# Patient Record
Sex: Female | Born: 1995 | Hispanic: Yes | Marital: Single | Smoking: Never smoker
Health system: Southern US, Community
[De-identification: ages and names within clinical notes are randomized; demographics above are authoritative.]

## PROBLEM LIST (undated history)

## (undated) DIAGNOSIS — J45909 Unspecified asthma, uncomplicated: Secondary | ICD-10-CM

## (undated) DIAGNOSIS — M797 Fibromyalgia: Secondary | ICD-10-CM

## (undated) DIAGNOSIS — D8989 Other specified disorders involving the immune mechanism, not elsewhere classified: Secondary | ICD-10-CM

## (undated) DIAGNOSIS — E079 Disorder of thyroid, unspecified: Secondary | ICD-10-CM

## (undated) DIAGNOSIS — M352 Behcet's disease: Secondary | ICD-10-CM

## (undated) DIAGNOSIS — G43909 Migraine, unspecified, not intractable, without status migrainosus: Secondary | ICD-10-CM

---

## 2014-06-13 ENCOUNTER — Emergency Department (HOSPITAL_COMMUNITY)
Admission: EM | Admit: 2014-06-13 | Discharge: 2014-06-13 | Disposition: A | Discharge status: Home / Self Care | Payer: Medicaid Other | Attending: Emergency Medicine | Admitting: Emergency Medicine

## 2014-06-13 ENCOUNTER — Encounter (HOSPITAL_COMMUNITY): Payer: Self-pay | Admitting: Emergency Medicine

## 2014-06-13 DIAGNOSIS — M352 Behcet's disease: Secondary | ICD-10-CM | POA: Diagnosis not present

## 2014-06-13 DIAGNOSIS — Z7952 Long term (current) use of systemic steroids: Secondary | ICD-10-CM | POA: Insufficient documentation

## 2014-06-13 DIAGNOSIS — R52 Pain, unspecified: Secondary | ICD-10-CM | POA: Diagnosis present

## 2014-06-13 DIAGNOSIS — Z792 Long term (current) use of antibiotics: Secondary | ICD-10-CM | POA: Insufficient documentation

## 2014-06-13 DIAGNOSIS — Z79899 Other long term (current) drug therapy: Secondary | ICD-10-CM | POA: Diagnosis not present

## 2014-06-13 HISTORY — DX: Other specified disorders involving the immune mechanism, not elsewhere classified: D89.89

## 2014-06-13 HISTORY — DX: Fibromyalgia: M79.7

## 2014-06-13 LAB — URINE MICROSCOPIC-ADD ON

## 2014-06-13 LAB — URINALYSIS, ROUTINE W REFLEX MICROSCOPIC
BILIRUBIN URINE: NEGATIVE
Glucose, UA: NEGATIVE mg/dL
HGB URINE DIPSTICK: NEGATIVE
Ketones, ur: NEGATIVE mg/dL
NITRITE: NEGATIVE
PH: 7 (ref 5.0–8.0)
Protein, ur: NEGATIVE mg/dL
Specific Gravity, Urine: 1.013 (ref 1.005–1.030)
Urobilinogen, UA: 0.2 mg/dL (ref 0.0–1.0)

## 2014-06-13 MED ORDER — OXYCODONE-ACETAMINOPHEN 5-325 MG PO TABS: 1.0000 | ORAL_TABLET | ORAL | Status: DC | PRN

## 2014-06-13 MED ORDER — MAGIC MOUTHWASH W/LIDOCAINE: 5.0000 mL | Freq: Three times a day (TID) | ORAL | Status: DC

## 2014-06-13 MED ORDER — DEXAMETHASONE SODIUM PHOSPHATE 10 MG/ML IJ SOLN
10.0000 mg | Freq: Once | INTRAMUSCULAR | Status: AC
  Administered 2014-06-13: 10 mg via INTRAMUSCULAR
  Filled 2014-06-13: qty 1

## 2014-06-13 MED ORDER — PREDNISONE 20 MG PO TABS: 40.0000 mg | ORAL_TABLET | Freq: Every day | ORAL | Status: DC

## 2014-06-13 NOTE — Discharge Instructions (Signed)
Behcet Syndrome Behcet syndrome is a rare disorder that involves inflammation of blood vessels (vasculitis) throughout your body. This condition usually begins between the ages of 20 years and 40 years. Behcet syndrome can range from mild to severe and is a condition you will have for the rest of your life (chronic). There is no cure, but symptoms may go away on their own for periods of time. It can cause various symptoms, including open sores (ulcers) in your mouth or on your genitals. It can affect many organs and systems in your body, including your heart, lungs, digestive system, and nervous systems. It can sometimes lead to blindness. Behcet syndrome is not spread from person to person (contagious). CAUSES  The exact cause is unknown. The condition tends to run in families. Some genes that increase risk for Behcet syndrome have been identified. If you inherit these genes, it may increase your risk.  RISK FACTORS  Being of Asian, Middle Eastern, or Turkish descent.  Being 20-40 years of age. SYMPTOMS  Signs and symptoms vary depending on the areas of the body that are affected. Early and common signs and symptoms include:   Open sores on your:  Mouth. These may look like canker sores. The sores may come and go.  Genitals. These may come and go and leave scars when they heal.  Skin. These may appear as painful red bumps or pimples.  Eye problems including:  Redness.  Blurred vision.  Tearing.  Pain.  Inflammation (uveitis).  Arthritis.   Swelling of the brain and spinal cord (meningoencephalitis). Less common signs and symptoms may develop over time and can include:   Abdominal pain and bleeding in your digestive system.  Memory loss.  Behavior changes.  Loss of interest in things you enjoy (apathy).  Seizures.  Blood clots.  Weakening of blood vessels (aneurysms).  Chest pain.  Trouble breathing.  Impaired speech, balance, and movement. DIAGNOSIS  Behcet  syndrome is hard to diagnose because there will be times when you are symptom free. Your health care provider may diagnose the condition based on your medical history and a physical exam. The main factors that help confirm the diagnosis are presence of mouth sores at least three times in 1 year and any two of the following:  Genital sores that keep coming back.  Eye inflammation with loss of vision.  Skin sores that are characteristic of Behcet syndrome.  A positive skin prick test. If you have the condition, a skin prick may produce a red bump in 1-2 days. Yourhealth care provider may perform additional tests, including:   CT or MRI scans of your joints, brain, or bones.  Blood vessel studies (angiogram).  Removing pieces of affected body tissue (biopsy) to check for vasculitis. TREATMENT  There is no cure for Behcet syndrome. Treatment typically focuses on relieving your discomfort and preventing serious complications. You may need to work with a team of health care providers because many different parts of your body may be involved. Common treatments include:  Strong anti-inflammatory medicines (corticosteroids).  Medicines that suppress your immune system and treat inflammation.  Steroid creams to treat oral and genital ulcers.  Other medicines your health care team may recommend based on your symptoms and the parts of your body involved. HOME CARE INSTRUCTIONS Follow all your health care provider's instructions. Theinstructions you get will depend on your specific symptoms and treatments. General instructions may include:  Get plenty of rest, especially when your symptoms worsen.  Get moderate exercise (  walking and swimming) when not experiencing worsening of symptoms.  Include lots of vegetables, fruits, and whole grains in your diet.  Avoid high-fat foods.  Do not smoke.  Keep all follow-up appointments. SEEK MEDICAL CARE IF:  Your symptoms worsen and are not  managed by medicines and home care. SEEK IMMEDIATE MEDICAL CARE IF:  You suddenly lose your vision.  You vomit blood or have blood in your stool.  You have very bad abdominal pain.  You suddenly have a very bad headache.  You have a seizure.  You have a red, warm, or tender swelling in your leg.  You have chest pain or trouble breathing. FOR MORE INFORMATION American Behcet's Disease Association: www.behcets.com Document Released: 02/20/2002 Document Revised: 03/07/2013 Document Reviewed: 01/31/2013 ExitCare Patient Information 2015 [employer]. This information is not intended to replace advice given to you by your health care provider. Make sure you discuss any questions you have with your health care provider.  

## 2014-06-13 NOTE — ED Provider Notes (Signed)
CSN: [id number]     Arrival date & time 06/13/14  1352 History   First MD Initiated Contact with Patient 06/13/14 1515     Chief Complaint  Patient presents with  . body inflammed   . auto immune      (Consider location/radiation/quality/duration/timing/severity/associated sxs/prior Treatment) HPI   Raylene Paolucci Is a [age] year old female with a past medical history of Behcet's syndrome. She presents emergency Department with complaint of Behcet's flare. [place] The patient is uninsured and has been unable to follow-up with a rheumatologist. She states that she normally has aching in her muscles, which she deals with on a daily basis. However, over the past week she has developed ulcerations on the tongue and inside of her cheeks. She is also beginning to develop ulcers on her genital region and her left breast. The patient states that she came in for a round of prednisone and she hopes to halt her flare before it continues to worsen. The patient has previously been on immunosuppressive therapy is which included infusion therapies. The patient normally takes 40 mg of prednisone daily. She has been out of all of her medications for the past month. She denies any other medical complaints at this time.  Past Medical History  Diagnosis Date  . Fibromyalgia   . Autoimmune disorder    History reviewed. No pertinent past surgical history. No family history on file. History  Substance Use Topics  . Smoking status: Never Smoker   . Smokeless tobacco: Not on file  . Alcohol Use: No   OB History    No data available     Review of Systems  Ten systems reviewed and are negative for acute change, except as noted in the HPI.    Allergies  Review of patient's allergies indicates no known allergies.  Home Medications   Prior to Admission medications   Medication Sig Start Date End Date Taking? Authorizing Provider  azathioprine (IMURAN) 100  MG tablet Take 100 mg by mouth daily.   Yes Historical Provider, MD  azaTHIOprine (IMURAN) 50 MG tablet Take 50 mg by mouth daily.   Yes Historical Provider, MD  DULoxetine (CYMBALTA) 20 MG capsule Take 20 mg by mouth daily.   Yes Historical Provider, MD  levothyroxine (SYNTHROID, LEVOTHROID) 100 MCG tablet Take 100 mcg by mouth daily before breakfast.   Yes Historical Provider, MD  metaxalone (SKELAXIN) 800 MG tablet Take 800 mg by mouth daily.   Yes Historical Provider, MD  methylphenidate 18 MG PO CR tablet Take 18 mg by mouth daily.   Yes Historical Provider, MD  predniSONE (DELTASONE) 20 MG tablet Take 40 mg by mouth daily with breakfast.   Yes Historical Provider, MD  zolpidem (AMBIEN) 5 MG tablet Take 5 mg by mouth at bedtime as needed for sleep.   Yes Historical Provider, MD   BP 103/61 mmHg  Pulse 95  Temp(Src) 98.5 F (36.9 C) (Oral)  Resp 18  Ht 5' 1.5" (1.562 m)  Wt 144 lb 9 oz (65.573 kg)  BMI 26.88 kg/m2  SpO2 98%  LMP 05/31/2014 Physical Exam  Constitutional: She is oriented to person, place, and time. She appears well-developed and well-nourished. No distress.  HENT:  Head: Normocephalic and atraumatic.  Shallow ulcerations on the palmar, left bucca mucosa. Hard pallet is erythematous with minimal swelling  Eyes: Conjunctivae are normal. No scleral icterus.  Neck: Normal range of motion.  Cardiovascular: Normal rate, regular rhythm and normal  heart sounds.  Exam reveals no gallop and no friction rub.   No murmur heard. Pulmonary/Chest: Effort normal and breath sounds normal. No respiratory distress.  ulcer present on the left breast  Abdominal: Soft. Bowel sounds are normal. She exhibits no distension and no mass. There is no tenderness. There is no guarding.  Genitourinary:  Genital exam deferred  Neurological: She is alert and oriented to person, place, and time.  Skin: Skin is warm and dry. She is not diaphoretic.  Nursing note and vitals reviewed.   ED Course   Procedures (including critical care time) Labs Review Labs Reviewed  URINALYSIS, ROUTINE W REFLEX MICROSCOPIC    Imaging Review No results found.   EKG Interpretation None      MDM   Final diagnoses:  Behcet's syndrome    Patient with patient's flare. Given a dose of IM Decadron here. We'll discharge with 40 mg of prednisone daily. Patient will also get much mouthwash for her stomatitis as well as pain medication. Patient is advised [facility]-term care. She appears safe for discharge at this time.    Arthor Captain, PA-C 06/13/14 1645  Gwyneth Sprout, MD 06/14/14 [id number]

## 2014-06-13 NOTE — ED Notes (Addendum)
Pt states that she has auto-immune disorders and takes medications and gets Rimacade infusions but moved [place] from [place] and not set up with PCP.  Pt c/o leg pain and feels like her entire body is inflamed.

## 2014-06-13 NOTE — ED Notes (Addendum)
Patient sees several specialist for her autoimmune disorder, but as previously stated she has not obtained a new doctor [place]  Patient has Behcets Syndrome and periodically has flare ups. She has been off of her medication for at least a month.

## 2014-06-14 ENCOUNTER — Ambulatory Visit: Payer: Medicaid Other | Attending: Physician Assistant | Admitting: Physician Assistant

## 2014-06-14 VITALS — BP 88/54 | HR 75 | Temp 97.9°F | Resp 16 | Ht 61.5 in | Wt 144.0 lb

## 2014-06-14 DIAGNOSIS — F909 Attention-deficit hyperactivity disorder, unspecified type: Secondary | ICD-10-CM | POA: Diagnosis not present

## 2014-06-14 DIAGNOSIS — G8929 Other chronic pain: Secondary | ICD-10-CM | POA: Insufficient documentation

## 2014-06-14 DIAGNOSIS — M352 Behcet's disease: Secondary | ICD-10-CM

## 2014-06-14 DIAGNOSIS — F329 Major depressive disorder, single episode, unspecified: Secondary | ICD-10-CM | POA: Insufficient documentation

## 2014-06-14 MED ORDER — OXYCODONE-ACETAMINOPHEN 5-325 MG PO TABS: 1.0000 | ORAL_TABLET | ORAL | Status: DC | PRN

## 2014-06-14 MED ORDER — LEVOTHYROXINE SODIUM 100 MCG PO TABS: 100.0000 ug | ORAL_TABLET | Freq: Every day | ORAL | Status: DC

## 2014-06-14 MED ORDER — DULOXETINE HCL 20 MG PO CPEP: 20.0000 mg | ORAL_CAPSULE | Freq: Every day | ORAL | Status: DC

## 2014-06-14 MED ORDER — AZATHIOPRINE 100 MG PO TABS: 100.0000 mg | ORAL_TABLET | Freq: Every day | ORAL | Status: DC

## 2014-06-14 MED ORDER — MAGIC MOUTHWASH W/LIDOCAINE: 5.0000 mL | Freq: Three times a day (TID) | ORAL | Status: DC

## 2014-06-14 MED ORDER — METHYLPHENIDATE HCL ER (OSM) 18 MG PO TBCR: 18.0000 mg | EXTENDED_RELEASE_TABLET | Freq: Every day | ORAL | Status: DC

## 2014-06-14 MED ORDER — PREDNISONE 20 MG PO TABS: 40.0000 mg | ORAL_TABLET | Freq: Every day | ORAL | Status: DC

## 2014-06-14 MED ORDER — ONDANSETRON HCL 4 MG PO TABS: 4.0000 mg | ORAL_TABLET | Freq: Three times a day (TID) | ORAL | Status: DC | PRN

## 2014-06-14 NOTE — Progress Notes (Addendum)
Patient presents for med refills for auto immune disorders Ran out of all meds 1 month ago Recently moved to area and wants to establish care with PCP C/o b/l knee pain rates 7/10 at present Low right back pain; rates 6/10 at present Inflammation in mouth which she states leads to ulcers Also irritation on left breast and genital area States father in hospital at present awaiting heart transplant States hx of depression especially during disease flares Never smoker  BP 88/54 states she felt she might pass out this morning but not at present  PHQ-9 score of 13 Gad 12 Provider aware Patient also given information on counseling with in-house LCSW

## 2014-06-14 NOTE — Patient Instructions (Signed)
We will work on an BankerAppointment with financial counselor I have refilled all your medications We will work on a referral to rheumatology

## 2014-06-14 NOTE — Progress Notes (Signed)
Ashley Anthony  ZOX:[id number]  [id number]  DOB - [birth year]  Chief Complaint  Patient presents with  . Knee Pain    bilateral  . Back Pain  . Mouth Lesions       Subjective:   Ashley Anthony is a 19 y.o. female here today for establishment of care. She presented to the emergency department on 06/13/2014 with complaints of right lower back pain, bilateral knee pain, and ulcers in her mouth and on her genital area. She was diagnosed with Behcet's Syndrome at the age of 74. At this time she lived in [place]. She most recently comes to [place] by way of [employer], [place]. She is only been here for 1 month. During that time she's not had any medical insurance and has not been on any medications. She clearly was having a flare of her autoimmune disorder and was given an IM injection of Decadron, prednisone tablets to go home on, and a mouthwash. These medications were filled. She has being given minimal improvement in her symptoms. She is looking for refills on her chronic medications and potentially a referral to rheumatology for further assistance with her autoimmune disorder. She has also been off her thyroid and ADHD medications. Her complaints continue as above. Nothing new that this presentation.   ROS: GEN: denies fever or chills, denies change in weight Skin: positive lesions and rashes in mouth and pubic area HEENT: denies headache, earache, epistaxis, sore throat, or neck pain LUNGS: denies SHOB, dyspnea, PND, orthopnea CV: denies CP or palpitations ABD: denies abd pain, N or V EXT: + muscle spasms or swelling; + pain in lower ext, no weakness NEURO: denies numbness or tingling, denies sz, stroke or TIA  ALLERGIES: No Known Allergies  PAST MEDICAL HISTORY: Past Medical History  Diagnosis Date  . Fibromyalgia   . Autoimmune disorder   ADHD MDD  PAST SURGICAL HISTORY: History reviewed. No pertinent past surgical history.  MEDICATIONS AT HOME: Prior to Admission  medications   Medication Sig Start Date End Date Taking? Authorizing Provider  oxyCODONE-acetaminophen (PERCOCET) 5-325 MG per tablet Take 1 tablet by mouth every 4 (four) hours as needed. 06/14/14  Yes Lyndel Sarate Netta Cedars, PA-C  predniSONE (DELTASONE) 20 MG tablet Take 2 tablets (40 mg total) by mouth daily. 06/14/14  Yes Theone Bowell Netta Cedars, PA-C  Alum & Mag Hydroxide-Simeth (MAGIC MOUTHWASH W/LIDOCAINE) SOLN Take 5 mLs by mouth 3 (three) times daily. 06/14/14   Beverly Suriano Netta Cedars, PA-C  azathioprine (IMURAN) 100 MG tablet Take 1 tablet (100 mg total) by mouth daily. 06/14/14   Caidynce Muzyka Netta Cedars, PA-C  azaTHIOprine (IMURAN) 50 MG tablet Take 50 mg by mouth daily.    Historical Provider, MD  DULoxetine (CYMBALTA) 20 MG capsule Take 1 capsule (20 mg total) by mouth daily. 06/14/14   Vivianne Master, PA-C  levothyroxine (SYNTHROID, LEVOTHROID) 100 MCG tablet Take 1 tablet (100 mcg total) by mouth daily before breakfast. 06/14/14   Vivianne Master, PA-C  metaxalone (SKELAXIN) 800 MG tablet Take 800 mg by mouth daily.    Historical Provider, MD  methylphenidate 18 MG PO CR tablet Take 1 tablet (18 mg total) by mouth daily. 06/14/14   Vivianne Master, PA-C  zolpidem (AMBIEN) 5 MG tablet Take 5 mg by mouth at bedtime as needed for sleep.    Historical Provider, MD     Objective:   Filed Vitals:   06/14/14 0939  BP: 88/54  Pulse: 75  Temp: 97.9 F (36.6 C)  TempSrc:  Oral  Resp: 16  Height: 5' 1.5" (1.562 m)  Weight: 144 lb (65.318 kg)  SpO2: 5%    Exam General appearance : Awake, alert, not in any distress. Speech Clear. Not toxic looking HEENT: Atraumatic and Normocephalic, pupils equally reactive to light and accomodation; mouth raised round plaques/lesions especially left tongue Neck: supple, no JVD. No cervical lymphadenopathy.  Chest:Good air entry bilaterally, no added sounds  CVS: S1 S2 regular, no murmurs.  Extremities: B/L Lower Ext shows no edema, both legs are warm to touch  Data Review No results  found for: HGBA1C   Assessment & Plan  1. Behcet's Syndrome  -refill chronic meds>Imuran, Prednisone, pain meds, mouthwash  -referral to Rheumatology  -reassurance for improvement over 1-3 weeks  -offered topical steroids, pt declines at this time 2. Depression  -refill Cymbalta  3. Chronic Pain 2/2 #1  -refill Percocet 4. ADHD  -refill Methylphenidate  Referral to financial counselor Return in about 2 weeks (around 06/28/2014).  The patient was given clear instructions to go to ER or return to medical center if symptoms don't improve, worsen or new problems develop. The patient verbalized understanding. The patient was told to call to get lab results if they haven't heard anything in the next week.   This note has been created with Education officer, environmentalDragon speech recognition software and smart phrase technology. Any transcriptional errors are unintentional.    Scot Juniffany Amaya Blakeman, PA-C [facility] [place], [place] [phone]   06/14/2014, 10:23 AM

## 2014-06-15 ENCOUNTER — Telehealth: Payer: Self-pay | Admitting: General Practice

## 2014-06-15 NOTE — Telephone Encounter (Signed)
Patient was seen in clinic yesterday, 06/14/14 by NP, Tiffany for ED follow up. Patient states she was to be prescribed Ambien for insomnia. Patient states she picked up all of her other medications but the Remus Lofflerambien was not ready.  Please follow up with patient to assist.

## 2014-06-18 ENCOUNTER — Encounter: Payer: Self-pay | Admitting: *Deleted

## 2014-06-18 ENCOUNTER — Telehealth: Payer: Self-pay | Admitting: *Deleted

## 2014-06-18 NOTE — Telephone Encounter (Signed)
Patient called in to get RX for Ambien.  She has follow up appointment next week.  Told her that the PA Tiffany was not in the office today and so med could not be refilled.  Instructed her to wait for her appointment next week and ask provider for refill then.

## 2014-06-19 ENCOUNTER — Telehealth: Payer: Self-pay

## 2014-06-19 NOTE — Telephone Encounter (Signed)
Not available at number provided

## 2014-06-27 ENCOUNTER — Ambulatory Visit: Payer: Medicaid Other | Attending: Internal Medicine | Admitting: Internal Medicine

## 2014-06-27 ENCOUNTER — Encounter: Payer: Self-pay | Admitting: Internal Medicine

## 2014-06-27 VITALS — BP 108/73 | HR 84 | Temp 98.0°F | Resp 16 | Wt 144.2 lb

## 2014-06-27 DIAGNOSIS — G4700 Insomnia, unspecified: Secondary | ICD-10-CM | POA: Insufficient documentation

## 2014-06-27 DIAGNOSIS — F32A Depression, unspecified: Secondary | ICD-10-CM | POA: Insufficient documentation

## 2014-06-27 DIAGNOSIS — F909 Attention-deficit hyperactivity disorder, unspecified type: Secondary | ICD-10-CM

## 2014-06-27 DIAGNOSIS — G894 Chronic pain syndrome: Secondary | ICD-10-CM | POA: Diagnosis not present

## 2014-06-27 DIAGNOSIS — E039 Hypothyroidism, unspecified: Secondary | ICD-10-CM | POA: Insufficient documentation

## 2014-06-27 DIAGNOSIS — F329 Major depressive disorder, single episode, unspecified: Secondary | ICD-10-CM | POA: Diagnosis not present

## 2014-06-27 DIAGNOSIS — M352 Behcet's disease: Secondary | ICD-10-CM | POA: Diagnosis present

## 2014-06-27 MED ORDER — DULOXETINE HCL 30 MG PO CPEP: 30.0000 mg | ORAL_CAPSULE | Freq: Every day | ORAL | Status: DC

## 2014-06-27 NOTE — Progress Notes (Signed)
Patient here for follow up and to establish with a primary physician Patient was diagnosed at the age of 19 with behcets syndrome Suffers from chronic pain Requesting medication for pain

## 2014-06-27 NOTE — Patient Instructions (Signed)
Insomnia Insomnia is frequent trouble falling and/or staying asleep. Insomnia can be a long term problem or a short term problem. Both are common. Insomnia can be a short term problem when the wakefulness is related to a certain stress or worry. Long term insomnia is often related to ongoing stress during waking hours and/or poor sleeping habits. Overtime, sleep deprivation itself can make the problem worse. Every little thing feels more severe because you are overtired and your ability to cope is decreased. CAUSES   Stress, anxiety, and depression.  Poor sleeping habits.  Distractions such as TV in the bedroom.  Naps close to bedtime.  Engaging in emotionally charged conversations before bed.  Technical reading before sleep.  Alcohol and other sedatives. They may make the problem worse. They can hurt normal sleep patterns and normal dream activity.  Stimulants such as caffeine for several hours prior to bedtime.  Pain syndromes and shortness of breath can cause insomnia.  Exercise late at night.  Changing time zones may cause sleeping problems (jet lag). It is sometimes helpful to have someone observe your sleeping patterns. They should look for periods of not breathing during the night (sleep apnea). They should also look to see how long those periods last. If you live alone or observers are uncertain, you can also be observed at a sleep clinic where your sleep patterns will be professionally monitored. Sleep apnea requires a checkup and treatment. Give your caregivers your medical history. Give your caregivers observations your family has made about your sleep.  SYMPTOMS   Not feeling rested in the morning.  Anxiety and restlessness at bedtime.  Difficulty falling and staying asleep. TREATMENT   Your caregiver may prescribe treatment for an underlying medical disorders. Your caregiver can give advice or help if you are using alcohol or other drugs for self-medication. Treatment  of underlying problems will usually eliminate insomnia problems.  Medications can be prescribed for short time use. They are generally not recommended for lengthy use.  Over-the-counter sleep medicines are not recommended for lengthy use. They can be habit forming.  You can promote easier sleeping by making lifestyle changes such as:  Using relaxation techniques that help with breathing and reduce muscle tension.  Exercising earlier in the day.  Changing your diet and the time of your last meal. No night time snacks.  Establish a regular time to go to bed.  Counseling can help with stressful problems and worry.  Soothing music and white noise may be helpful if there are background noises you cannot remove.  Stop tedious detailed work at least one hour before bedtime. HOME CARE INSTRUCTIONS   Keep a diary. Inform your caregiver about your progress. This includes any medication side effects. See your caregiver regularly. Take note of:  Times when you are asleep.  Times when you are awake during the night.  The quality of your sleep.  How you feel the next day. This information will help your caregiver care for you.  Get out of bed if you are still awake after 15 minutes. Read or do some quiet activity. Keep the lights down. Wait until you feel sleepy and go back to bed.  Keep regular sleeping and waking hours. Avoid naps.  Exercise regularly.  Avoid distractions at bedtime. Distractions include watching television or engaging in any intense or detailed activity like attempting to balance the household checkbook.  Develop a bedtime ritual. Keep a familiar routine of bathing, brushing your teeth, climbing into bed at the same   time each night, listening to soothing music. Routines increase the success of falling to sleep faster.  Use relaxation techniques. This can be using breathing and muscle tension release routines. It can also include visualizing peaceful scenes. You can  also help control troubling or intruding thoughts by keeping your mind occupied with boring or repetitive thoughts like the old concept of counting sheep. You can make it more creative like imagining planting one beautiful flower after another in your backyard garden.  During your day, work to eliminate stress. When this is not possible use some of the previous suggestions to help reduce the anxiety that accompanies stressful situations. MAKE SURE YOU:   Understand these instructions.  Will watch your condition.  Will get help right away if you are not doing well or get worse. Document Released: 02/28/2000 Document Revised: 05/25/2011 Document Reviewed: 03/30/2007 ExitCare Patient Information 2015 [employer]. This information is not intended to replace advice given to you by your health care provider. Make sure you discuss any questions you have with your health care provider.  

## 2014-06-27 NOTE — Progress Notes (Signed)
MRN: [id number] Name: Ashley Anthony  Sex: female Age: 19 y.o. DOB: 08/01/1995  Allergies: Review of patient's allergies indicates no known allergies.  Chief Complaint  Patient presents with  . Follow-up    HPI: Patient is 19 y.o. female who  has history of behcet's syndrome , ADHD, chronic pain, hypothyroidism, depression, [place] and went to the emergency room 2 weeks ago with a flare up and subsequently followed with nurse practitioner this office, at that time she was given refill on her medications, patient has also been referred to rheumatology. Patient is accompanied with her mother, also reported to have seen a psychiatrist in the past with a history of depression/fibromyalgia currently taking Cymbalta she is still complaining of pain, patient is also requesting some medication to help her with the sleep.as per patient after taking prednisone her ulcers are improved.  Past Medical History  Diagnosis Date  . Fibromyalgia   . Autoimmune disorder     No past surgical history on file.    Medication List       This list is accurate as of: 06/27/14 11:39 AM.  Always use your most recent med list.               azaTHIOprine 50 MG tablet  Commonly known as:  IMURAN  Take 50 mg by mouth daily.     azathioprine 100 MG tablet  Commonly known as:  IMURAN  Take 1 tablet (100 mg total) by mouth daily.     DULoxetine 30 MG capsule  Commonly known as:  CYMBALTA  Take 1 capsule (30 mg total) by mouth daily.     levothyroxine 100 MCG tablet  Commonly known as:  SYNTHROID, LEVOTHROID  Take 1 tablet (100 mcg total) by mouth daily before breakfast.     magic mouthwash w/lidocaine Soln  Take 5 mLs by mouth 3 (three) times daily.     metaxalone 800 MG tablet  Commonly known as:  SKELAXIN  Take 800 mg by mouth daily.     methylphenidate 18 MG CR tablet  Commonly known as:  CONCERTA  Take 1 tablet (18 mg total) by mouth daily.     ondansetron  4 MG tablet  Commonly known as:  ZOFRAN  Take 1 tablet (4 mg total) by mouth every 8 (eight) hours as needed for nausea or vomiting.     oxyCODONE-acetaminophen 5-325 MG per tablet  Commonly known as:  PERCOCET  Take 1 tablet by mouth every 4 (four) hours as needed.     predniSONE 20 MG tablet  Commonly known as:  DELTASONE  Take 2 tablets (40 mg total) by mouth daily.     zolpidem 5 MG tablet  Commonly known as:  AMBIEN  Take 5 mg by mouth at bedtime as needed for sleep.        Meds ordered this encounter  Medications  . DULoxetine (CYMBALTA) 30 MG capsule    Sig: Take 1 capsule (30 mg total) by mouth daily.    Dispense:  30 capsule    Refill:  3    Immunization History  Administered Date(s) Administered  . Influenza-Unspecified 03/22/2014    Family History  Problem Relation Age of Onset  . Heart disease Father   . Cancer Maternal Aunt   . Diabetes Maternal Grandmother   . Diabetes Paternal Grandfather     History  Substance Use Topics  . Smoking status: Never Smoker   . Smokeless tobacco: Not  on file  . Alcohol Use: No    Review of Systems  Respiratory: Negative for cough and choking.   Cardiovascular: Negative for chest pain.  Gastrointestinal: Negative for nausea, vomiting, abdominal pain and diarrhea.  Musculoskeletal: Positive for arthralgias.  Psychiatric/Behavioral: The patient is nervous/anxious.      As noted in HPI  Filed Vitals:   06/27/14 1025  BP: 108/73  Pulse: 84  Temp: 98 F (36.7 C)  Resp: 16    Physical Exam  Physical Exam  Constitutional: No distress.  Eyes: EOM are normal. Pupils are equal, round, and reactive to light.  Cardiovascular: Normal rate and regular rhythm.   Pulmonary/Chest: Breath sounds normal. No respiratory distress. She has no wheezes. She has no rales.    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  Hypothyroidism, unspecified hypothyroidism type Patient was recently resumed back on her levothyroxine 100 mcg daily, check TSH level on the following visit.  Chronic pain syndrome - Plan: Ambulatory referral to Pain Clinic, I have increased the dose of DULoxetine (CYMBALTA) 30 MG capsule  Depression - Plan: Ambulatory referral to Psychiatry, I have increased the dose of DULoxetine (CYMBALTA) 30 MG capsule  Insomnia Patient is advised for sleep hygiene also she'll take Melatonin.  Attention deficit hyperactivity disorder (ADHD), unspecified ADHD type - Plan: Ambulatory referral to Psychiatry    Return in about 3 months (around 09/26/2014), or if symptoms worsen or fail to improve, for hypothyroid.   This note has been created with Education officer, environmental. Any transcriptional errors are unintentional.    Doris Cheadle, MD

## 2014-07-03 NOTE — Telephone Encounter (Signed)
Error

## 2014-07-09 ENCOUNTER — Telehealth: Payer: Self-pay | Admitting: Clinical

## 2014-07-09 NOTE — Telephone Encounter (Signed)
First attempt to contact pt after referral

## 2014-07-20 ENCOUNTER — Encounter (HOSPITAL_COMMUNITY): Payer: Self-pay | Admitting: Emergency Medicine

## 2014-07-20 ENCOUNTER — Emergency Department (HOSPITAL_COMMUNITY)
Admission: EM | Admit: 2014-07-20 | Discharge: 2014-07-20 | Disposition: A | Discharge status: Home / Self Care | Payer: Medicaid Other | Attending: Emergency Medicine | Admitting: Emergency Medicine

## 2014-07-20 DIAGNOSIS — L98491 Non-pressure chronic ulcer of skin of other sites limited to breakdown of skin: Secondary | ICD-10-CM | POA: Diagnosis not present

## 2014-07-20 DIAGNOSIS — M352 Behcet's disease: Secondary | ICD-10-CM

## 2014-07-20 DIAGNOSIS — Z79899 Other long term (current) drug therapy: Secondary | ICD-10-CM | POA: Diagnosis not present

## 2014-07-20 DIAGNOSIS — M791 Myalgia, unspecified site: Secondary | ICD-10-CM

## 2014-07-20 DIAGNOSIS — J45909 Unspecified asthma, uncomplicated: Secondary | ICD-10-CM | POA: Insufficient documentation

## 2014-07-20 DIAGNOSIS — E079 Disorder of thyroid, unspecified: Secondary | ICD-10-CM | POA: Insufficient documentation

## 2014-07-20 DIAGNOSIS — R52 Pain, unspecified: Secondary | ICD-10-CM | POA: Diagnosis present

## 2014-07-20 HISTORY — DX: Disorder of thyroid, unspecified: E07.9

## 2014-07-20 HISTORY — DX: Unspecified asthma, uncomplicated: J45.909

## 2014-07-20 MED ORDER — PREDNISONE 20 MG PO TABS
40.0000 mg | ORAL_TABLET | Freq: Once | ORAL | Status: AC
  Administered 2014-07-20: 40 mg via ORAL
  Filled 2014-07-20: qty 2

## 2014-07-20 MED ORDER — PREDNISONE 20 MG PO TABS: 40.0000 mg | ORAL_TABLET | Freq: Once | ORAL | Status: DC

## 2014-07-20 MED ORDER — OXYCODONE-ACETAMINOPHEN 5-325 MG PO TABS: 1.0000 | ORAL_TABLET | Freq: Four times a day (QID) | ORAL | Status: DC | PRN

## 2014-07-20 NOTE — ED Notes (Signed)
Pt states she has multiple autoimmune diseases, c/o flare-up that started about a week ago. Pt reports facial swelling, "inflammation everywhere," and generalized body pain. No difficulty breathing or swallowing, pt speaking in complete sentences without difficulty.

## 2014-07-20 NOTE — Discharge Instructions (Signed)
Behcet Syndrome Behcet syndrome is a rare disorder that involves inflammation of blood vessels (vasculitis) throughout your body. This condition usually begins between the ages of 20 years and 40 years. Behcet syndrome can range from mild to severe and is a condition you will have for the rest of your life (chronic). There is no cure, but symptoms may go away on their own for periods of time. It can cause various symptoms, including open sores (ulcers) in your mouth or on your genitals. It can affect many organs and systems in your body, including your heart, lungs, digestive system, and nervous systems. It can sometimes lead to blindness. Behcet syndrome is not spread from person to person (contagious). CAUSES  The exact cause is unknown. The condition tends to run in families. Some genes that increase risk for Behcet syndrome have been identified. If you inherit these genes, it may increase your risk.  RISK FACTORS  Being of Asian, Middle [place], or [place] descent.  Being 7120-20 years of age. SYMPTOMS  Signs and symptoms vary depending on the areas of the body that are affected. Early and common signs and symptoms include:   Open sores on your:  Mouth. These may look like canker sores. The sores may come and go.  Genitals. These may come and go and leave scars when they heal.  Skin. These may appear as painful red bumps or pimples.  Eye problems including:  Redness.  Blurred vision.  Tearing.  Pain.  Inflammation (uveitis).  Arthritis.   Swelling of the brain and spinal cord (meningoencephalitis). Less common signs and symptoms may develop over time and can include:   Abdominal pain and bleeding in your digestive system.  Memory loss.  Behavior changes.  Loss of interest in things you enjoy (apathy).  Seizures.  Blood clots.  Weakening of blood vessels (aneurysms).  Chest pain.  Trouble breathing.  Impaired speech, balance, and movement. DIAGNOSIS  Behcet  syndrome is hard to diagnose because there will be times when you are symptom free. Your health care provider may diagnose the condition based on your medical history and a physical exam. The main factors that help confirm the diagnosis are presence of mouth sores at least three times in 1 year and any two of the following:  Genital sores that keep coming back.  Eye inflammation with loss of vision.  Skin sores that are characteristic of Behcet syndrome.  A positive skin prick test. If you have the condition, a skin prick may produce a red bump in 1-2 days. Yourhealth care provider may perform additional tests, including:   CT or MRI scans of your joints, brain, or bones.  Blood vessel studies (angiogram).  Removing pieces of affected body tissue (biopsy) to check for vasculitis. TREATMENT  There is no cure for Behcet syndrome. Treatment typically focuses on relieving your discomfort and preventing serious complications. You may need to work with a team of health care providers because many different parts of your body may be involved. Common treatments include:  Strong anti-inflammatory medicines (corticosteroids).  Medicines that suppress your immune system and treat inflammation.  Steroid creams to treat oral and genital ulcers.  Other medicines your health care team may recommend based on your symptoms and the parts of your body involved. HOME CARE INSTRUCTIONS Follow all your health care provider's instructions. Theinstructions you get will depend on your specific symptoms and treatments. General instructions may include:  Get plenty of rest, especially when your symptoms worsen.  Get moderate exercise (  walking and swimming) when not experiencing worsening of symptoms.  Include lots of vegetables, fruits, and whole grains in your diet.  Avoid high-fat foods.  Do not smoke.  Keep all follow-up appointments. SEEK MEDICAL CARE IF:  Your symptoms worsen and are not  managed by medicines and home care. SEEK IMMEDIATE MEDICAL CARE IF:  You suddenly lose your vision.  You vomit blood or have blood in your stool.  You have very bad abdominal pain.  You suddenly have a very bad headache.  You have a seizure.  You have a red, warm, or tender swelling in your leg.  You have chest pain or trouble breathing. FOR MORE INFORMATION American Behcet's Disease Association: www.behcets.com Document Released: 02/20/2002 Document Revised: 03/07/2013 Document Reviewed: 01/31/2013 [facility] Patient Information 2015 [employer] [place]. This information is not intended to replace advice given to you by your health care provider. Make sure you discuss any questions you have with your health care provider. You've been given a prescription for prednisone and Percocet to control your symptoms.  He [facility] so you can have a local endocrinologist to monitor your thyroid disease.  Please [facility]

## 2014-07-20 NOTE — ED Provider Notes (Signed)
CSN: [id number]     Arrival date & time 07/20/14  2045 History   None   This chart was scribed for NP, Earley FavorGail Leon Montoya, working with Bethann BerkshireJoseph Zammit, MD by Marica OtterNusrat Rahman, ED Scribe. This patient was seen in room WTR5/WTR5 and the patient's care was started at 9:34 PM.  Chief Complaint  Patient presents with  . generalized pain    The history is provided by the patient. No language interpreter was used.   PCP: Doris CheadleADVANI, DEEPAK, MD HPI Comments: Ashley Anthony is a 19 y.o. female, with PMH noted below including bechet's symdrome, fibromyalgia and autoimmune disorder, who presents to the Emergency Department complaining of chronic generalized pain with the current episode onset one week ago. 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   Pt denies SOB, trouble swallowing, or any other Sx at this time.   MEDS: Pt reports she has been off all of her meds for the past 1.5 months.   PCP VISIT: Pt was seen for similar Sx by her PCP a couple of weeks ago whereby she was referred to rheumatology. Pt recently got health insurance and plans to set up an appointment with the rheumatologist. Pt also notes she has been tried to make an appointment with her PCP without success.   PRIOR TX FOR SX: Pt reports that she has been treated successfully with prednisone for prior flare ups.   Past Medical History  Diagnosis Date  . Fibromyalgia   . Autoimmune disorder   . Thyroid disease   . Asthma    History reviewed. No pertinent past surgical history. Family History  Problem Relation Age of Onset  . Heart disease Father   . Cancer Maternal Aunt   . Diabetes Maternal Grandmother   . Diabetes Paternal Grandfather    History  Substance Use Topics  . Smoking status: Never Smoker   . Smokeless tobacco: Not on file  .  Alcohol Use: No   OB History    No data available     Review of Systems  Constitutional: Negative for fever and chills.  HENT: Negative for trouble swallowing.   Respiratory: Negative for shortness of breath.   Musculoskeletal: Positive for myalgias.  Skin: Positive for wound.       Ulcer to left breast   Neurological: Negative for speech difficulty.  Psychiatric/Behavioral: Negative for confusion.  All other systems reviewed and are negative.     Allergies  Review of patient's allergies indicates no known allergies.  Home Medications   Prior to Admission medications   Medication Sig Start Date End Date Taking? Authorizing Provider  Alum & Mag Hydroxide-Simeth (MAGIC MOUTHWASH W/LIDOCAINE) SOLN Take 5 mLs by mouth 3 (three) times daily. 06/14/14  Yes Tiffany Netta CedarsS Noel, PA-C  DULoxetine (CYMBALTA) 20 MG capsule Take 20 mg by mouth daily.   Yes Historical Provider, MD  ondansetron (ZOFRAN) 4 MG tablet Take 1 tablet (4 mg total) by mouth every 8 (eight) hours as needed for nausea or vomiting. 06/14/14  Yes Tiffany Netta CedarsS Noel, PA-C  azathioprine (IMURAN) 100 MG tablet Take 1 tablet (100 mg total) by mouth daily. 06/14/14   Tiffany Netta CedarsS Noel, PA-C  azaTHIOprine (IMURAN) 50 MG tablet Take 50 mg by mouth daily.    Historical Provider, MD  DULoxetine (CYMBALTA) 30 MG capsule Take 1 capsule (  30 mg total) by mouth daily. Patient not taking: Reported on 07/20/2014 06/27/14   Doris Cheadleeepak Advani, MD  levothyroxine (SYNTHROID, LEVOTHROID) 100 MCG tablet Take 1 tablet (100 mcg total) by mouth daily before breakfast. 06/14/14   Vivianne Masteriffany S Noel, PA-C  metaxalone (SKELAXIN) 800 MG tablet Take 800 mg by mouth daily.    Historical Provider, MD  methylphenidate 18 MG PO CR tablet Take 1 tablet (18 mg total) by mouth daily. 06/14/14   Vivianne Masteriffany S Noel, PA-C  oxyCODONE-acetaminophen (PERCOCET) 5-325 MG per tablet Take 1 tablet by mouth every 6 (six) hours as needed. 07/20/14   Earley FavorGail Vicenta Olds, NP  predniSONE (DELTASONE) 20 MG tablet  Take 2 tablets (40 mg total) by mouth once. 07/20/14   Earley FavorGail Addis Bennie, NP  zolpidem (AMBIEN) 5 MG tablet Take 5 mg by mouth at bedtime as needed for sleep.    Historical Provider, MD   Triage Vitals: BP 110/61 mmHg  Pulse 91  Temp(Src) 98.4 F (36.9 C) (Oral)  Resp 18  SpO2 100%  LMP 06/26/2014 Physical Exam  Constitutional: She is oriented to person, place, and time. She appears well-developed and well-nourished. No distress.  HENT:  Head: Normocephalic and atraumatic.  Eyes: Conjunctivae and EOM are normal.  Neck: Neck supple.  Cardiovascular: Normal rate.   Pulmonary/Chest: Effort normal. No respiratory distress.  Musculoskeletal: Normal range of motion.  Neurological: She is alert and oriented to person, place, and time.  Skin: Skin is warm and dry.  Ulceration to left areola 1 o'clock location, 1 cm in diameter, superficial  Psychiatric: She has a normal mood and affect. Her behavior is normal.  Nursing note and vitals reviewed.   ED Course  Procedures (including critical care time) DIAGNOSTIC STUDIES: Oxygen Saturation is 100% on RA, nl by my interpretation.    COORDINATION OF CARE: 9:42 PM-Discussed treatment plan which includes prednisone taper and endocrinology referral with pt at bedside and pt agreed to plan.   Labs Review Labs Reviewed - No data to display  Imaging Review No results found.   EKG Interpretation None      MDM   Final diagnoses:  Behcet's syndrome  Myalgia  Skin ulcer, limited to breakdown of skin       I personally performed the services described in this documentation, which was scribed in my presence. The recorded information has been reviewed and is accurate.   Earley FavorGail Frankey Botting, NP 07/20/14 [id number]  Bethann BerkshireJoseph Zammit, MD 07/20/14 [phone]

## 2014-08-08 ENCOUNTER — Encounter (HOSPITAL_COMMUNITY): Payer: Self-pay | Admitting: Emergency Medicine

## 2014-08-08 ENCOUNTER — Emergency Department (HOSPITAL_COMMUNITY): Payer: Medicaid Other

## 2014-08-08 ENCOUNTER — Emergency Department (HOSPITAL_COMMUNITY)
Admission: EM | Admit: 2014-08-08 | Discharge: 2014-08-08 | Disposition: A | Discharge status: Home / Self Care | Payer: Medicaid Other | Attending: Emergency Medicine | Admitting: Emergency Medicine

## 2014-08-08 DIAGNOSIS — Z8679 Personal history of other diseases of the circulatory system: Secondary | ICD-10-CM | POA: Diagnosis not present

## 2014-08-08 DIAGNOSIS — R079 Chest pain, unspecified: Secondary | ICD-10-CM | POA: Diagnosis not present

## 2014-08-08 DIAGNOSIS — E079 Disorder of thyroid, unspecified: Secondary | ICD-10-CM | POA: Insufficient documentation

## 2014-08-08 DIAGNOSIS — Z7952 Long term (current) use of systemic steroids: Secondary | ICD-10-CM | POA: Diagnosis not present

## 2014-08-08 DIAGNOSIS — M352 Behcet's disease: Secondary | ICD-10-CM | POA: Insufficient documentation

## 2014-08-08 DIAGNOSIS — Z79899 Other long term (current) drug therapy: Secondary | ICD-10-CM | POA: Insufficient documentation

## 2014-08-08 DIAGNOSIS — J45909 Unspecified asthma, uncomplicated: Secondary | ICD-10-CM | POA: Insufficient documentation

## 2014-08-08 HISTORY — DX: Behcet's disease: M35.2

## 2014-08-08 HISTORY — DX: Migraine, unspecified, not intractable, without status migrainosus: G43.909

## 2014-08-08 LAB — CBC WITH DIFFERENTIAL/PLATELET
Basophils Absolute: 0.1 10*3/uL (ref 0.0–0.1)
Basophils Relative: 1 % (ref 0–1)
EOS PCT: 2 % (ref 0–5)
Eosinophils Absolute: 0.2 10*3/uL (ref 0.0–0.7)
HCT: 31.4 % — ABNORMAL LOW (ref 36.0–46.0)
Hemoglobin: 10.5 g/dL — ABNORMAL LOW (ref 12.0–15.0)
Lymphocytes Relative: 30 % (ref 12–46)
Lymphs Abs: 2 10*3/uL (ref 0.7–4.0)
MCH: 29.5 pg (ref 26.0–34.0)
MCHC: 33.4 g/dL (ref 30.0–36.0)
MCV: 88.2 fL (ref 78.0–100.0)
MONO ABS: 0.2 10*3/uL (ref 0.1–1.0)
MONOS PCT: 3 % (ref 3–12)
NEUTROS ABS: 4.1 10*3/uL (ref 1.7–7.7)
Neutrophils Relative %: 64 % (ref 43–77)
Platelets: 500 10*3/uL — ABNORMAL HIGH (ref 150–400)
RBC: 3.56 MIL/uL — ABNORMAL LOW (ref 3.87–5.11)
RDW: 14 % (ref 11.5–15.5)
WBC: 6.5 10*3/uL (ref 4.0–10.5)

## 2014-08-08 LAB — BASIC METABOLIC PANEL
Anion gap: 8 (ref 5–15)
BUN: 10 mg/dL (ref 6–20)
CHLORIDE: 105 mmol/L (ref 101–111)
CO2: 27 mmol/L (ref 22–32)
CREATININE: 0.61 mg/dL (ref 0.44–1.00)
Calcium: 8.9 mg/dL (ref 8.9–10.3)
GFR calc Af Amer: >60 mL/min (ref >60)
GFR calc non Af Amer: >60 mL/min (ref >60)
Glucose, Bld: 88 mg/dL (ref 65–99)
Potassium: 3.6 mmol/L (ref 3.5–5.1)
Sodium: 140 mmol/L (ref 135–145)

## 2014-08-08 MED ORDER — HYDROCODONE-ACETAMINOPHEN 5-325 MG PO TABS
1.0000 | ORAL_TABLET | Freq: Once | ORAL | Status: AC
  Administered 2014-08-08: 1 via ORAL
  Filled 2014-08-08: qty 1

## 2014-08-08 MED ORDER — GI COCKTAIL ~~LOC~~
30.0000 mL | Freq: Once | ORAL | Status: AC
  Administered 2014-08-08: 30 mL via ORAL
  Filled 2014-08-08: qty 30

## 2014-08-08 MED ORDER — MORPHINE SULFATE 4 MG/ML IJ SOLN
4.0000 mg | Freq: Once | INTRAMUSCULAR | Status: AC
  Administered 2014-08-08: 4 mg via INTRAVENOUS
  Filled 2014-08-08: qty 1

## 2014-08-08 MED ORDER — ONDANSETRON HCL 4 MG PO TABS
4.0000 mg | ORAL_TABLET | Freq: Once | ORAL | Status: AC
  Administered 2014-08-08: 4 mg via ORAL
  Filled 2014-08-08: qty 1

## 2014-08-08 MED ORDER — HYDROCODONE-ACETAMINOPHEN 5-325 MG PO TABS: 1.0000 | ORAL_TABLET | ORAL | Status: DC | PRN

## 2014-08-08 MED ORDER — ONDANSETRON HCL 4 MG/2ML IJ SOLN
4.0000 mg | Freq: Once | INTRAMUSCULAR | Status: AC
  Administered 2014-08-08: 4 mg via INTRAVENOUS
  Filled 2014-08-08: qty 2

## 2014-08-08 MED ORDER — OMEPRAZOLE 20 MG PO CPDR: 20.0000 mg | DELAYED_RELEASE_CAPSULE | Freq: Every day | ORAL | Status: DC

## 2014-08-08 NOTE — ED Provider Notes (Signed)
CSN: [id number]     Arrival date & time 08/08/14  1023 History   First MD Initiated Contact with Patient 08/08/14 1029     Chief Complaint  Patient presents with  . Chest Pain  . Shortness of Breath  . Nausea      HPI Patient presents emergency department complaining of ongoing chest discomfort over the past 24 hours.  She has a history of Behcet's syndrome and receives Remicade infusions.  She's had allergic reactions to Remicade before in the past when [place] living in [place] and she just restarted them through the care of [facility].  Her infusion was 48 hours ago.  She states she had some allergic like symptoms while the infusion was going on and she was given slight Medrol and Benadryl.  She states she was feeling better.  She now reports that she's developed some discomfort that is sharp in nature in her anterior chest since last night.  Not improved this morning and thus presented to the ER for further evaluation.  No fevers or chills.  Denies hematemesis.  No melena or hematochezia.  Denies abdominal pain.  Reports the itching or difficulty reading.  No throat tightness.  Anterior chest is tender to palpation per the patient   Past Medical History  Diagnosis Date  . Fibromyalgia   . Autoimmune disorder   . Thyroid disease   . Asthma   . Migraine   . Behcet's syndrome    History reviewed. No pertinent past surgical history. Family History  Problem Relation Age of Onset  . Heart disease Father   . Cancer Maternal Aunt   . Diabetes Maternal Grandmother   . Diabetes Paternal Grandfather    History  Substance Use Topics  . Smoking status: Never Smoker   . Smokeless tobacco: Not on file  . Alcohol Use: No   OB History    No data available     Review of Systems  All other systems reviewed and are negative.     Allergies  Remicade  Home Medications   Prior to Admission medications   Medication Sig Start Date End Date Taking? Authorizing Provider  albuterol  (PROVENTIL HFA;VENTOLIN HFA) 108 (90 BASE) MCG/ACT inhaler Inhale 2 puffs into the lungs every 6 (six) hours as needed for wheezing or shortness of breath.   Yes Historical Provider, MD  Alum & Mag Hydroxide-Simeth (MAGIC MOUTHWASH W/LIDOCAINE) SOLN Take 5 mLs by mouth 3 (three) times daily. 06/14/14  Yes Tiffany Netta CedarsS Noel, PA-C  azaTHIOprine (IMURAN) 50 MG tablet Take 150 mg by mouth daily.   Yes Historical Provider, MD  levothyroxine (SYNTHROID, LEVOTHROID) 100 MCG tablet Take 1 tablet (100 mcg total) by mouth daily before breakfast. 06/14/14  Yes Tiffany Netta CedarsS Noel, PA-C  metaxalone (SKELAXIN) 800 MG tablet Take 800 mg by mouth daily.   Yes Historical Provider, MD  methylphenidate (RITALIN) 10 MG tablet Take 10 mg by mouth daily.   Yes Historical Provider, MD  methylphenidate 36 MG PO CR tablet Take 36 mg by mouth daily.   Yes Historical Provider, MD  ondansetron (ZOFRAN) 4 MG tablet Take 1 tablet (4 mg total) by mouth every 8 (eight) hours as needed for nausea or vomiting. 06/14/14  Yes Tiffany Netta CedarsS Noel, PA-C  predniSONE (DELTASONE) 20 MG tablet Take 2 tablets (40 mg total) by mouth once. 07/20/14  Yes Earley FavorGail Schulz, NP  predniSONE (DELTASONE) 20 MG tablet Take 40 mg by mouth daily with breakfast.   Yes Historical Provider, MD  azathioprine (IMURAN) 100 MG tablet Take 1 tablet (100 mg total) by mouth daily. Patient not taking: Reported on 08/08/2014 06/14/14   Vivianne Master, PA-C  DULoxetine (CYMBALTA) 30 MG capsule Take 1 capsule (30 mg total) by mouth daily. Patient not taking: Reported on 07/20/2014 06/27/14   Doris Cheadle, MD  methylphenidate 18 MG PO CR tablet Take 1 tablet (18 mg total) by mouth daily. Patient not taking: Reported on 08/08/2014 06/14/14   Vivianne Master, PA-C  oxyCODONE-acetaminophen (PERCOCET) 5-325 MG per tablet Take 1 tablet by mouth every 6 (six) hours as needed. Patient not taking: Reported on 08/08/2014 07/20/14   Earley Favor, NP   BP 107/59 mmHg  Pulse 75  Temp(Src) 98.3 F (36.8 C)  (Oral)  Resp 18  SpO2 100%  LMP 08/07/2014 Physical Exam  Constitutional: She is oriented to person, place, and time. She appears well-developed and well-nourished. No distress.  HENT:  Head: Normocephalic and atraumatic.  Eyes: EOM are normal.  Neck: Normal range of motion.  Cardiovascular: Normal rate, regular rhythm and normal heart sounds.   Pulmonary/Chest: Effort normal and breath sounds normal.  Abdominal: Soft. She exhibits no distension. There is no tenderness.  Musculoskeletal: Normal range of motion.  Neurological: She is alert and oriented to person, place, and time.  Skin: Skin is warm and dry.  Psychiatric: She has a normal mood and affect. Judgment normal.  Nursing note and vitals reviewed.   ED Course  Procedures (including critical care time) Labs Review Labs Reviewed  CBC WITH DIFFERENTIAL/PLATELET - Abnormal; Notable for the following:    RBC 3.56 (*)    Hemoglobin 10.5 (*)    HCT 31.4 (*)    Platelets 500 (*)    All other components within normal limits  BASIC METABOLIC PANEL    Imaging Review Dg Chest 2 View  08/08/2014   CLINICAL DATA:  Anaphylactic drug reaction. Persistent nausea and chest pain.  EXAM: CHEST  2 VIEW  COMPARISON:  None.  FINDINGS: Trachea is midline. Heart size normal. Lungs are clear. No pleural fluid.  IMPRESSION: No acute findings.   Electronically Signed   By: Leanna Battles M.D.   On: 08/08/2014 11:48     EKG Interpretation   Date/Time:  Wednesday Aug 08 2014 10:32:08 EDT Ventricular Rate:  78 PR Interval:  136 QRS Duration: 88 QT Interval:  395 QTC Calculation: 450 R Axis:   77 Text Interpretation:  Sinus rhythm Baseline wander in lead(s) V5 V6 No old  tracing to compare Confirmed by Kobe Jansma  MD, Caryn Bee ([id number]) on 08/08/2014  10:45:20 AM      MDM   Final diagnoses:  None    Patient feels better at this time.  Doubt ACS.  Doubt PE.  Discharge home with primary care and subspecialty follow-up.  She understands  return the emergency department for new or worsening symptoms.  This seems to be either more gastroesophageal reflux disease-related or musculoskeletal.    Azalia Bilis, MD 08/08/14 1251

## 2014-08-08 NOTE — ED Notes (Signed)
Pt states she had an infusion Monday of Remicade for her Bahcet's syndrome. While there she had what she states was an anaphylactic reaction to the drug. They gave her benadryl and solumedrol. She was told if the chest pain and nausea continue to go to the ER. Pt states the chest pain and nausea has continued and increased since Monday.

## 2014-08-08 NOTE — Discharge Instructions (Signed)

## 2014-08-16 ENCOUNTER — Encounter (HOSPITAL_COMMUNITY): Payer: Self-pay

## 2014-08-16 ENCOUNTER — Emergency Department (HOSPITAL_COMMUNITY): Payer: Medicaid Other

## 2014-08-16 ENCOUNTER — Emergency Department (HOSPITAL_COMMUNITY)
Admission: EM | Admit: 2014-08-16 | Discharge: 2014-08-16 | Disposition: A | Discharge status: Home / Self Care | Payer: Medicaid Other | Attending: Emergency Medicine | Admitting: Emergency Medicine

## 2014-08-16 DIAGNOSIS — J45909 Unspecified asthma, uncomplicated: Secondary | ICD-10-CM | POA: Diagnosis not present

## 2014-08-16 DIAGNOSIS — Z79899 Other long term (current) drug therapy: Secondary | ICD-10-CM | POA: Diagnosis not present

## 2014-08-16 DIAGNOSIS — Z3202 Encounter for pregnancy test, result negative: Secondary | ICD-10-CM | POA: Insufficient documentation

## 2014-08-16 DIAGNOSIS — Z8679 Personal history of other diseases of the circulatory system: Secondary | ICD-10-CM | POA: Diagnosis not present

## 2014-08-16 DIAGNOSIS — K5900 Constipation, unspecified: Secondary | ICD-10-CM | POA: Diagnosis not present

## 2014-08-16 DIAGNOSIS — R319 Hematuria, unspecified: Secondary | ICD-10-CM | POA: Diagnosis present

## 2014-08-16 DIAGNOSIS — E079 Disorder of thyroid, unspecified: Secondary | ICD-10-CM | POA: Diagnosis not present

## 2014-08-16 DIAGNOSIS — N12 Tubulo-interstitial nephritis, not specified as acute or chronic: Secondary | ICD-10-CM | POA: Diagnosis not present

## 2014-08-16 DIAGNOSIS — Z8739 Personal history of other diseases of the musculoskeletal system and connective tissue: Secondary | ICD-10-CM | POA: Diagnosis not present

## 2014-08-16 LAB — URINALYSIS, ROUTINE W REFLEX MICROSCOPIC
Bilirubin Urine: NEGATIVE
GLUCOSE, UA: NEGATIVE mg/dL
Ketones, ur: NEGATIVE mg/dL
Nitrite: NEGATIVE
PROTEIN: NEGATIVE mg/dL
Specific Gravity, Urine: 1.011 (ref 1.005–1.030)
Urobilinogen, UA: 0.2 mg/dL (ref 0.0–1.0)
pH: 7.5 (ref 5.0–8.0)

## 2014-08-16 LAB — CBC WITH DIFFERENTIAL/PLATELET
BASOS ABS: 0 10*3/uL (ref 0.0–0.1)
Basophils Relative: 0 % (ref 0–1)
Eosinophils Absolute: 0.2 10*3/uL (ref 0.0–0.7)
Eosinophils Relative: 2 % (ref 0–5)
HCT: 30.4 % — ABNORMAL LOW (ref 36.0–46.0)
HEMOGLOBIN: 10.3 g/dL — AB (ref 12.0–15.0)
Lymphocytes Relative: 11 % — ABNORMAL LOW (ref 12–46)
Lymphs Abs: 1 10*3/uL (ref 0.7–4.0)
MCH: 29.2 pg (ref 26.0–34.0)
MCHC: 33.9 g/dL (ref 30.0–36.0)
MCV: 86.1 fL (ref 78.0–100.0)
MONOS PCT: 4 % (ref 3–12)
Monocytes Absolute: 0.4 10*3/uL (ref 0.1–1.0)
NEUTROS ABS: 7.5 10*3/uL (ref 1.7–7.7)
NEUTROS PCT: 83 % — AB (ref 43–77)
Platelets: 288 10*3/uL (ref 150–400)
RBC: 3.53 MIL/uL — ABNORMAL LOW (ref 3.87–5.11)
RDW: 15.1 % (ref 11.5–15.5)
WBC: 9.1 10*3/uL (ref 4.0–10.5)

## 2014-08-16 LAB — COMPREHENSIVE METABOLIC PANEL
ALBUMIN: 4.1 g/dL (ref 3.5–5.0)
ALK PHOS: 58 U/L (ref 38–126)
ALT: 11 U/L — ABNORMAL LOW (ref 14–54)
AST: 16 U/L (ref 15–41)
Anion gap: 9 (ref 5–15)
BUN: 10 mg/dL (ref 6–20)
CO2: 26 mmol/L (ref 22–32)
CREATININE: 0.65 mg/dL (ref 0.44–1.00)
Calcium: 9.3 mg/dL (ref 8.9–10.3)
Chloride: 104 mmol/L (ref 101–111)
Glucose, Bld: 91 mg/dL (ref 65–99)
Potassium: 3.8 mmol/L (ref 3.5–5.1)
Sodium: 139 mmol/L (ref 135–145)
Total Bilirubin: 0.8 mg/dL (ref 0.3–1.2)
Total Protein: 7.6 g/dL (ref 6.5–8.1)

## 2014-08-16 LAB — LIPASE, BLOOD: Lipase: 21 U/L — ABNORMAL LOW (ref 22–51)

## 2014-08-16 LAB — URINE MICROSCOPIC-ADD ON

## 2014-08-16 LAB — POC URINE PREG, ED: PREG TEST UR: NEGATIVE

## 2014-08-16 MED ORDER — ONDANSETRON HCL 4 MG/2ML IJ SOLN
4.0000 mg | Freq: Once | INTRAMUSCULAR | Status: AC
  Administered 2014-08-16: 4 mg via INTRAVENOUS
  Filled 2014-08-16: qty 2

## 2014-08-16 MED ORDER — SODIUM CHLORIDE 0.9 % IV BOLUS (SEPSIS)
1000.0000 mL | Freq: Once | INTRAVENOUS | Status: AC
  Administered 2014-08-16: 1000 mL via INTRAVENOUS

## 2014-08-16 MED ORDER — HYDROCODONE-ACETAMINOPHEN 5-325 MG PO TABS: 1.0000 | ORAL_TABLET | ORAL | Status: DC | PRN

## 2014-08-16 MED ORDER — CEPHALEXIN 500 MG PO CAPS: 500.0000 mg | ORAL_CAPSULE | Freq: Four times a day (QID) | ORAL | Status: DC

## 2014-08-16 MED ORDER — CEFTRIAXONE SODIUM 1 G IJ SOLR
1.0000 g | INTRAMUSCULAR | Status: DC
  Administered 2014-08-16: 1 g via INTRAVENOUS
  Filled 2014-08-16: qty 10

## 2014-08-16 MED ORDER — ONDANSETRON 8 MG PO TBDP: 8.0000 mg | ORAL_TABLET | Freq: Three times a day (TID) | ORAL | Status: DC | PRN

## 2014-08-16 MED ORDER — HYDROMORPHONE HCL 1 MG/ML IJ SOLN
0.5000 mg | Freq: Once | INTRAMUSCULAR | Status: AC
  Administered 2014-08-16: 0.5 mg via INTRAVENOUS
  Filled 2014-08-16: qty 1

## 2014-08-16 NOTE — ED Notes (Signed)
Patient states she has had intermittent right abdominal pain x over a year, but the pain is consistent. Patient also c/o right lower back pain and hematuria.

## 2014-08-16 NOTE — Discharge Instructions (Signed)
Take ibuprofen for pain. Norco for severe pain. Zofran as needed for nausea. Keflex for infection. Please follow-up with her primary care doctor for recheck as scheduled. Return if her symptoms are worsening    Pyelonephritis, Adult Pyelonephritis is a kidney infection. In general, there are 2 main types of pyelonephritis:  Infections that come on quickly without any warning (acute pyelonephritis).  Infections that persist for a long period of time (chronic pyelonephritis). CAUSES  Two main causes of pyelonephritis are:  Bacteria traveling from the bladder to the kidney. This is a problem especially in pregnant women. The urine in the bladder can become filled with bacteria from multiple causes, including:  Inflammation of the prostate gland (prostatitis).  Sexual intercourse in females.  Bladder infection (cystitis).  Bacteria traveling from the bloodstream to the tissue part of the kidney. Problems that may increase your risk of getting a kidney infection include:  Diabetes.  Kidney stones or bladder stones.  Cancer.  Catheters placed in the bladder.  Other abnormalities of the kidney or ureter. SYMPTOMS   Abdominal pain.  Pain in the side or flank area.  Fever.  Chills.  Upset stomach.  Blood in the urine (dark urine).  Frequent urination.  Strong or persistent urge to urinate.  Burning or stinging when urinating. DIAGNOSIS  Your caregiver may diagnose your kidney infection based on your symptoms. A urine sample may also be taken. TREATMENT  In general, treatment depends on how severe the infection is.   If the infection is mild and caught early, your caregiver may treat you with oral antibiotics and send you home.  If the infection is more severe, the bacteria may have gotten into the bloodstream. This will require intravenous (IV) antibiotics and a hospital stay. Symptoms may include:  High fever.  Severe flank pain.  Shaking chills.  Even after  a hospital stay, your caregiver may require you to be on oral antibiotics for a period of time.  Other treatments may be required depending upon the cause of the infection. HOME CARE INSTRUCTIONS   Take your antibiotics as directed. Finish them even if you start to feel better.  Make an appointment to have your urine checked to make sure the infection is gone.  Drink enough fluids to keep your urine clear or pale yellow.  Take medicines for the bladder if you have urgency and frequency of urination as directed by your caregiver. SEEK IMMEDIATE MEDICAL CARE IF:   You have a fever or persistent symptoms for more than 2-3 days.  You have a fever and your symptoms suddenly get worse.  You are unable to take your antibiotics or fluids.  You develop shaking chills.  You experience extreme weakness or fainting.  There is no improvement after 2 days of treatment. MAKE SURE YOU:  Understand these instructions.  Will watch your condition.  Will get help right away if you are not doing well or get worse. Document Released: 03/02/2005 Document Revised: 09/01/2011 Document Reviewed: 08/06/2010 [facility] Patient Information 2015 [employer] [place]. This information is not intended to replace advice given to you by your health care provider. Make sure you discuss any questions you have with your health care provider.

## 2014-08-16 NOTE — ED Provider Notes (Signed)
CSN: [id number]     Arrival date & time 08/16/14  1007 History   First MD Initiated Contact with Patient 08/16/14 1042     Chief Complaint  Patient presents with  . Back Pain  . Hematuria  . Abdominal Pain     (Consider location/radiation/quality/duration/timing/severity/associated sxs/prior Treatment) HPI Ashley Anthony is a 19 y.o. female with hx of fibromyalgia, behcet's syndrome, asthma, thyroid disease, presents to ED with right upper quadrant abdominal pain that radiates into the back. States pain for a year, worsened yesterday. Pt reports constipation, last BM 1 week ago. Reports associated nausea, no vomiting. Reports hematuria, urinary frequency, urgency, dysuria for last 2 days. Reports decreased appetite. No fever or chills. Has been taking prilosec and norco since last visit.   Past Medical History  Diagnosis Date  . Fibromyalgia   . Autoimmune disorder   . Thyroid disease   . Asthma   . Migraine   . Behcet's syndrome    History reviewed. No pertinent past surgical history. Family History  Problem Relation Age of Onset  . Heart disease Father   . Cancer Maternal Aunt   . Diabetes Maternal Grandmother   . Diabetes Paternal Grandfather    History  Substance Use Topics  . Smoking status: Never Smoker   . Smokeless tobacco: Never Used  . Alcohol Use: No   OB History    No data available     Review of Systems  Constitutional: Positive for fatigue. Negative for fever and chills.  Respiratory: Negative for cough, chest tightness and shortness of breath.   Cardiovascular: Negative for chest pain, palpitations and leg swelling.  Gastrointestinal: Positive for nausea, abdominal pain and constipation. Negative for vomiting and diarrhea.  Genitourinary: Positive for dysuria, urgency, frequency and hematuria. Negative for flank pain, vaginal bleeding, vaginal discharge, vaginal pain and pelvic pain.  Musculoskeletal: Negative for myalgias, arthralgias, neck pain and neck  stiffness.  Skin: Negative for rash.  Neurological: Positive for weakness. Negative for dizziness and headaches.  All other systems reviewed and are negative.     Allergies  Remicade  Home Medications   Prior to Admission medications   Medication Sig Start Date End Date Taking? Authorizing Provider  albuterol (PROVENTIL HFA;VENTOLIN HFA) 108 (90 BASE) MCG/ACT inhaler Inhale 2 puffs into the lungs every 6 (six) hours as needed for wheezing or shortness of breath.   Yes Historical Provider, MD  Alum & Mag Hydroxide-Simeth (MAGIC MOUTHWASH W/LIDOCAINE) SOLN Take 5 mLs by mouth 3 (three) times daily. 06/14/14  Yes Tiffany Netta Cedars, PA-C  azaTHIOprine (IMURAN) 50 MG tablet Take 150 mg by mouth daily.   Yes Historical Provider, MD  HYDROcodone-acetaminophen (NORCO/VICODIN) 5-325 MG per tablet Take 1 tablet by mouth every 4 (four) hours as needed for moderate pain. 08/08/14  Yes Azalia Bilis, MD  levothyroxine (SYNTHROID, LEVOTHROID) 100 MCG tablet Take 1 tablet (100 mcg total) by mouth daily before breakfast. 06/14/14  Yes Vivianne Master, PA-C  methylphenidate (RITALIN) 10 MG tablet Take 10 mg by mouth daily.   Yes Historical Provider, MD  methylphenidate 36 MG PO CR tablet Take 36 mg by mouth daily.   Yes Historical Provider, MD  omeprazole (PRILOSEC) 20 MG capsule Take 1 capsule (20 mg total) by mouth daily. 08/08/14  Yes Azalia Bilis, MD  ondansetron (ZOFRAN) 4 MG tablet Take 1 tablet (4 mg total) by mouth every 8 (eight) hours as needed for nausea or vomiting. 06/14/14  Yes Tiffany Netta Cedars, PA-C  azathioprine (IMURAN) 100  MG tablet Take 1 tablet (100 mg total) by mouth daily. Patient not taking: Reported on 08/08/2014 06/14/14   Vivianne Master, PA-C  predniSONE (DELTASONE) 20 MG tablet Take 2 tablets (40 mg total) by mouth once. Patient not taking: Reported on 08/16/2014 07/20/14   Earley Favor, NP   BP 104/65 mmHg  Pulse 94  Temp(Src) 98.2 F (36.8 C) (Oral)  Resp 16  Ht  (1.549 m)  Wt 150 lb  (68.04 kg)  BMI 28.36 kg/m2  SpO2 100%  LMP 08/07/2014 Physical Exam  Constitutional: She appears well-developed and well-nourished. No distress.  HENT:  Head: Normocephalic.  Eyes: Conjunctivae are normal.  Neck: Neck supple.  Cardiovascular: Normal rate, regular rhythm and normal heart sounds.   Pulmonary/Chest: Effort normal and breath sounds normal. No respiratory distress. She has no wheezes. She has no rales.  Abdominal: Soft. She exhibits no distension. There is tenderness. There is no rebound.  RUQ tenderness. Right CVA tenderness.   Musculoskeletal: She exhibits no edema.  Neurological: She is alert.  Skin: Skin is warm and dry.  Psychiatric: She has a normal mood and affect. Her behavior is normal.  Nursing note and vitals reviewed.   ED Course  Procedures (including critical care time) Labs Review Labs Reviewed  CBC WITH DIFFERENTIAL/PLATELET - Abnormal; Notable for the following:    RBC 3.53 (*)    Hemoglobin 10.3 (*)    HCT 30.4 (*)    Neutrophils Relative % 83 (*)    Lymphocytes Relative 11 (*)    All other components within normal limits  COMPREHENSIVE METABOLIC PANEL - Abnormal; Notable for the following:    ALT 11 (*)    All other components within normal limits  LIPASE, BLOOD - Abnormal; Notable for the following:    Lipase 21 (*)    All other components within normal limits  URINALYSIS, ROUTINE W REFLEX MICROSCOPIC (NOT AT [facility]) - Abnormal; Notable for the following:    APPearance CLOUDY (*)    Hgb urine dipstick MODERATE (*)    Leukocytes, UA MODERATE (*)    All other components within normal limits  URINE MICROSCOPIC-ADD ON - Abnormal; Notable for the following:    Squamous Epithelial / LPF FEW (*)    Bacteria, UA MANY (*)    All other components within normal limits  POC URINE PREG, ED    Imaging Review US Abdomen Complete  08/16/2014   CLINICAL DATA:  Abdominal pain and hematuria  EXAM: ULTRASOUND ABDOMEN COMPLETE  COMPARISON:  None.   FINDINGS: Gallbladder: No gallstones or wall thickening visualized. There is no pericholecystic fluid. No sonographic Murphy sign noted.  Common bile duct: Diameter: 5 mm. There is no intrahepatic, common hepatic, or common bile duct dilatation.  Liver: No focal lesion identified. Within normal limits in parenchymal echogenicity.  IVC: No abnormality visualized.  Pancreas: Visualized portion unremarkable. Portions of the pancreas are obscured by gas.  Spleen: Size and appearance within normal limits.  Right Kidney: Length: 10.5 cm. Echogenicity within normal limits. No mass or hydronephrosis visualized.  Left Kidney: Length: 10.8 cm. Echogenicity within normal limits. No mass or hydronephrosis visualized.  Abdominal aorta: No aneurysm visualized.  Other findings: No appreciable ascites.  IMPRESSION: Portions of pancreas obscured by gas. Visualized portions of pancreas appear normal. Study otherwise unremarkable.   Electronically Signed   By: Bretta Bang III M.D.   On: 08/16/2014 13:26     EKG Interpretation None      MDM  Final diagnoses:  Pyelonephritis    patient emergency department with right upper abdominal pain and right flank pain, dysuria, hematuria. Vital signs are normal. Will get labs, urinalysis, will get ultrasound abdomen to rule out cholelithiasis.   Urinalysis showing infection. Her labs are unremarkable, slightly low hemoglobin at 10.3. No elevation in white blood cell count. She is afebrile. Ultrasound unremarkable. Most likely pyelonephritis. She is tolerating by mouth fluids in emergency department with some nausea but no vomiting. Plan to discharge her home with Keflex for infection, Zofran for nausea, Norco for pain. Follow-up with primary care doctor. Patient states she has follow-up tomorrow. Return precautions discussed.   Filed Vitals:   08/16/14 1012 08/16/14 1239  BP: 104/65 103/64  Pulse: 94 83  Temp: 98.2 F (36.8 C)   TempSrc: Oral   Resp: 16 16   Height: 5\' 1"  (1.549 m)   Weight: 150 lb (68.04 kg)   SpO2: 100% 100%     Jaynie Crumbleatyana Chamika Cunanan, PA-C 08/16/14 1436  Geoffery Lyonsouglas Delo, MD 08/16/14 1615

## 2014-08-18 LAB — URINE CULTURE: Colony Count: 85000

## 2014-08-19 ENCOUNTER — Telehealth (HOSPITAL_COMMUNITY): Payer: Self-pay

## 2014-08-19 NOTE — Telephone Encounter (Signed)
Post ED Visit - Positive Culture Follow-up  Culture report reviewed by antimicrobial stewardship pharmacist: []  Wes Dulaney, Pharm.D., BCPS []  Celedonio MiyamotoJeremy Frens, Pharm.D., BCPS []  Georgina PillionElizabeth Martin, [address] BCPS []  WilliamsvilleMinh Pham, [address]Ph[address] BCPS, AAHIVP [x]  Estella HuskMichelle Turner, Pharm.D., BCPS, AAHIVP []  Elder CyphersLorie Poole, [address]Ph[address] BCPS  Positive Urine culture, >/= 85,000 colonies -> E Coli Treated with Cephalexin, organism sensitive to the same and no further patient follow-up is required at this time.  Arvid RightClark, Blue Ruggerio Dorn 08/19/2014, 7:35 PM

## 2014-08-22 ENCOUNTER — Encounter (HOSPITAL_COMMUNITY): Payer: Self-pay | Admitting: Emergency Medicine

## 2014-08-22 ENCOUNTER — Emergency Department (HOSPITAL_COMMUNITY)
Admission: EM | Admit: 2014-08-22 | Discharge: 2014-08-22 | Disposition: A | Discharge status: Home / Self Care | Payer: Medicaid Other | Attending: Emergency Medicine | Admitting: Emergency Medicine

## 2014-08-22 ENCOUNTER — Emergency Department (HOSPITAL_COMMUNITY): Payer: Medicaid Other

## 2014-08-22 DIAGNOSIS — L539 Erythematous condition, unspecified: Secondary | ICD-10-CM | POA: Diagnosis not present

## 2014-08-22 DIAGNOSIS — Z8739 Personal history of other diseases of the musculoskeletal system and connective tissue: Secondary | ICD-10-CM | POA: Diagnosis not present

## 2014-08-22 DIAGNOSIS — Z3202 Encounter for pregnancy test, result negative: Secondary | ICD-10-CM | POA: Insufficient documentation

## 2014-08-22 DIAGNOSIS — Z8679 Personal history of other diseases of the circulatory system: Secondary | ICD-10-CM | POA: Insufficient documentation

## 2014-08-22 DIAGNOSIS — R109 Unspecified abdominal pain: Secondary | ICD-10-CM | POA: Insufficient documentation

## 2014-08-22 DIAGNOSIS — Z79899 Other long term (current) drug therapy: Secondary | ICD-10-CM | POA: Insufficient documentation

## 2014-08-22 DIAGNOSIS — M352 Behcet's disease: Secondary | ICD-10-CM | POA: Insufficient documentation

## 2014-08-22 DIAGNOSIS — J45909 Unspecified asthma, uncomplicated: Secondary | ICD-10-CM | POA: Insufficient documentation

## 2014-08-22 DIAGNOSIS — Z8639 Personal history of other endocrine, nutritional and metabolic disease: Secondary | ICD-10-CM | POA: Diagnosis not present

## 2014-08-22 LAB — CBC WITH DIFFERENTIAL/PLATELET
BASOS ABS: 0 10*3/uL (ref 0.0–0.1)
Basophils Relative: 1 % (ref 0–1)
EOS PCT: 3 % (ref 0–5)
Eosinophils Absolute: 0.2 10*3/uL (ref 0.0–0.7)
HCT: 31.2 % — ABNORMAL LOW (ref 36.0–46.0)
Hemoglobin: 10.4 g/dL — ABNORMAL LOW (ref 12.0–15.0)
Lymphocytes Relative: 28 % (ref 12–46)
Lymphs Abs: 1.6 10*3/uL (ref 0.7–4.0)
MCH: 29.8 pg (ref 26.0–34.0)
MCHC: 33.3 g/dL (ref 30.0–36.0)
MCV: 89.4 fL (ref 78.0–100.0)
MONO ABS: 0.2 10*3/uL (ref 0.1–1.0)
Monocytes Relative: 3 % (ref 3–12)
Neutro Abs: 3.8 10*3/uL (ref 1.7–7.7)
Neutrophils Relative %: 65 % (ref 43–77)
Platelets: 315 10*3/uL (ref 150–400)
RBC: 3.49 MIL/uL — ABNORMAL LOW (ref 3.87–5.11)
RDW: 15.4 % (ref 11.5–15.5)
WBC: 5.8 10*3/uL (ref 4.0–10.5)

## 2014-08-22 LAB — COMPREHENSIVE METABOLIC PANEL
ALBUMIN: 3.9 g/dL (ref 3.5–5.0)
ALK PHOS: 51 U/L (ref 38–126)
ALT: 10 U/L — ABNORMAL LOW (ref 14–54)
ANION GAP: 8 (ref 5–15)
AST: 15 U/L (ref 15–41)
BUN: 11 mg/dL (ref 6–20)
CALCIUM: 9 mg/dL (ref 8.9–10.3)
CO2: 27 mmol/L (ref 22–32)
CREATININE: 0.78 mg/dL (ref 0.44–1.00)
Chloride: 105 mmol/L (ref 101–111)
GLUCOSE: 97 mg/dL (ref 65–99)
POTASSIUM: 3.9 mmol/L (ref 3.5–5.1)
SODIUM: 140 mmol/L (ref 135–145)
TOTAL PROTEIN: 7.2 g/dL (ref 6.5–8.1)
Total Bilirubin: 0.5 mg/dL (ref 0.3–1.2)

## 2014-08-22 LAB — URINALYSIS, ROUTINE W REFLEX MICROSCOPIC
Bilirubin Urine: NEGATIVE
GLUCOSE, UA: NEGATIVE mg/dL
Hgb urine dipstick: NEGATIVE
Ketones, ur: NEGATIVE mg/dL
Leukocytes, UA: NEGATIVE
NITRITE: NEGATIVE
Protein, ur: NEGATIVE mg/dL
SPECIFIC GRAVITY, URINE: 1.013 (ref 1.005–1.030)
UROBILINOGEN UA: 0.2 mg/dL (ref 0.0–1.0)
pH: 6 (ref 5.0–8.0)

## 2014-08-22 LAB — PREGNANCY, URINE: Preg Test, Ur: NEGATIVE

## 2014-08-22 LAB — LIPASE, BLOOD: Lipase: 20 U/L — ABNORMAL LOW (ref 22–51)

## 2014-08-22 MED ORDER — HYDROMORPHONE HCL 1 MG/ML IJ SOLN: 1.0000 mg | Freq: Once | INTRAMUSCULAR | Status: DC

## 2014-08-22 MED ORDER — ONDANSETRON HCL 4 MG/2ML IJ SOLN
4.0000 mg | Freq: Once | INTRAMUSCULAR | Status: AC
  Administered 2014-08-22: 4 mg via INTRAVENOUS
  Filled 2014-08-22: qty 2

## 2014-08-22 MED ORDER — HYDROMORPHONE HCL 1 MG/ML IJ SOLN
1.0000 mg | INTRAMUSCULAR | Status: DC | PRN
  Administered 2014-08-22: 1 mg via INTRAVENOUS
  Filled 2014-08-22: qty 1

## 2014-08-22 MED ORDER — IPRATROPIUM-ALBUTEROL 0.5-2.5 (3) MG/3ML IN SOLN: 3.0000 mL | RESPIRATORY_TRACT | Status: DC

## 2014-08-22 MED ORDER — PREDNISONE 20 MG PO TABS: 60.0000 mg | ORAL_TABLET | Freq: Once | ORAL | Status: DC

## 2014-08-22 MED ORDER — METHYLPREDNISOLONE SODIUM SUCC 125 MG IJ SOLR
125.0000 mg | Freq: Once | INTRAMUSCULAR | Status: AC
  Administered 2014-08-22: 125 mg via INTRAVENOUS
  Filled 2014-08-22: qty 2

## 2014-08-22 MED ORDER — PREDNISONE 20 MG PO TABS: 60.0000 mg | ORAL_TABLET | Freq: Every day | ORAL | Status: AC

## 2014-08-22 MED ORDER — LORAZEPAM 2 MG/ML IJ SOLN
1.0000 mg | Freq: Once | INTRAMUSCULAR | Status: AC
  Administered 2014-08-22: 1 mg via INTRAVENOUS
  Filled 2014-08-22: qty 1

## 2014-08-22 MED ORDER — SODIUM CHLORIDE 0.9 % IV BOLUS (SEPSIS)
1000.0000 mL | Freq: Once | INTRAVENOUS | Status: AC
  Administered 2014-08-22: 1000 mL via INTRAVENOUS

## 2014-08-22 MED ORDER — OXYCODONE-ACETAMINOPHEN 5-325 MG PO TABS: 1.0000 | ORAL_TABLET | Freq: Four times a day (QID) | ORAL | Status: DC | PRN

## 2014-08-22 MED ORDER — HYDROMORPHONE HCL 1 MG/ML IJ SOLN
1.0000 mg | Freq: Once | INTRAMUSCULAR | Status: AC
  Administered 2014-08-22: 1 mg via INTRAVENOUS
  Filled 2014-08-22: qty 1

## 2014-08-22 NOTE — ED Provider Notes (Signed)
CSN: [id number]     Arrival date & time 08/22/14  1343 History   First MD Initiated Contact with Patient 08/22/14 1508     Chief Complaint  Patient presents with  . Flank Pain  . Abdominal Pain  . Emesis  . 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  Pt was see on 08/16/14, dx with pyelonephritis and discharged home with keflex, zofran, and norco.  Pt states she went to [facility] on Monday, 08/20/14 for routine transfusion of remicade for her Behcet's syndrome but stated to have SOB nausea and vomiting, as well as worsening Right sided pain.  Transfusion was discontinued prior to completion. Pt reports having those transfusions in the past but has been having more and more reactions so it has been stopped indefinitely.  Pt reports vomiting 3 times today, states it feels like she is gagging on "acid reflux" and feels a burning sensation in her throat.  Right side pain is sharp, stabbing, 9/10 at worst, minimal relief with norco.  Pain keeps her up at night crying.  Pt also notes she is developing an ulceration on her Left breast and right side of tongue. States she normally receives steroids such as solumedrol for her ulcerations.  Denies fever or chills. Denies urinary or vaginal symptoms. Denies hx of kidney stones. Denies hx of gallstones or pancreatitis.   Past Medical History  Diagnosis Date  . Fibromyalgia   . Autoimmune disorder   . Thyroid disease   . Asthma   . Migraine   . Behcet's syndrome    History reviewed. No pertinent past surgical history. Family History  Problem Relation Age of Onset  . Heart disease Father   . Cancer Maternal  Aunt   . Diabetes Maternal Grandmother   . Diabetes Paternal Grandfather    History  Substance Use Topics  . Smoking status: Never Smoker   . Smokeless tobacco: Never Used  . Alcohol Use: No   OB History    No data available     Review of Systems  Constitutional: Positive for appetite change. Negative for fever, chills, diaphoresis and fatigue.  HENT: Positive for sore throat. Negative for congestion, trouble swallowing and voice change.   Respiratory: Negative for cough and shortness of breath.   Cardiovascular: Negative for chest pain, palpitations and leg swelling.  Gastrointestinal: Positive for nausea, vomiting and abdominal pain (Right side pain). Negative for diarrhea and constipation.  Genitourinary: Positive for flank pain (Right). Negative for dysuria, urgency, frequency, hematuria, decreased urine volume, vaginal bleeding, vaginal discharge, genital sores, vaginal pain, menstrual problem and pelvic pain.  Musculoskeletal: Positive for back pain (Right side). Negative for myalgias and joint swelling.  All other systems reviewed and are negative.     Allergies  Remicade  Home Medications   Prior to Admission medications   Medication Sig Start Date End Date Taking? Authorizing Provider  albuterol (PROVENTIL HFA;VENTOLIN HFA) 108 (90 BASE) MCG/ACT inhaler Inhale 2 puffs into the lungs every 6 (six) hours as needed for wheezing or shortness of breath.   Yes Historical Provider, MD  Alum & Mag Hydroxide-Simeth (MAGIC MOUTHWASH W/LIDOCAINE) SOLN Take 5 mLs by mouth 3 (three) times daily. 06/14/14  Yes Vivianne Master, PA-C  azaTHIOprine (IMURAN) 50 MG tablet Take 150 mg by mouth daily.   Yes Historical Provider, MD  cephALEXin (KEFLEX) 500 MG capsule Take 1 capsule (500 mg total) by mouth 4 (four) times daily. 08/16/14  Yes Tatyana Kirichenko, PA-C  HYDROcodone-acetaminophen (NORCO/VICODIN) 5-325 MG per tablet Take 1-2 tablets by mouth every 4 (four) hours as needed. 08/16/14  Yes  Tatyana Kirichenko, PA-C  hydrOXYzine (ATARAX/VISTARIL) 50 MG tablet Take 1-2 tablets by mouth daily. 08/17/14  Yes Historical Provider, MD  levothyroxine (SYNTHROID, LEVOTHROID) 100 MCG tablet Take 1 tablet (100 mcg total) by mouth daily before breakfast. 06/14/14  Yes Vivianne Masteriffany S Noel, PA-C  methylphenidate (RITALIN) 10 MG tablet Take 10 mg by mouth daily.   Yes Historical Provider, MD  methylphenidate 36 MG PO CR tablet Take 36 mg by mouth daily.   Yes Historical Provider, MD  omeprazole (PRILOSEC) 20 MG capsule Take 1 capsule (20 mg total) by mouth daily. 08/08/14  Yes Azalia BilisKevin Campos, MD  ondansetron (ZOFRAN ODT) 8 MG disintegrating tablet Take 1 tablet (8 mg total) by mouth every 8 (eight) hours as needed for nausea or vomiting. 08/16/14  Yes Tatyana Kirichenko, PA-C  azathioprine (IMURAN) 100 MG tablet Take 1 tablet (100 mg total) by mouth daily. Patient not taking: Reported on 08/08/2014 06/14/14   Vivianne Masteriffany S Noel, PA-C  ondansetron (ZOFRAN) 4 MG tablet Take 1 tablet (4 mg total) by mouth every 8 (eight) hours as needed for nausea or vomiting. Patient not taking: Reported on 08/22/2014 06/14/14   Vivianne Masteriffany S Noel, PA-C  oxyCODONE-acetaminophen (PERCOCET/ROXICET) 5-325 MG per tablet Take 1-2 tablets by mouth every 6 (six) hours as needed for severe pain. 08/22/14   Junius FinnerErin O'Malley, PA-C  predniSONE (DELTASONE) 20 MG tablet Take 2 tablets (40 mg total) by mouth once. Patient not taking: Reported on 08/16/2014 07/20/14   Earley FavorGail Schulz, NP  predniSONE (DELTASONE) 20 MG tablet Take 3 tablets (60 mg total) by mouth daily with breakfast. For 2 days 08/22/14   Junius FinnerErin O'Malley, PA-C   BP 116/81 mmHg  Pulse 80  Temp(Src) 98.7 F (37.1 C) (Oral)  Resp 16  SpO2 99%  LMP 08/07/2014 Physical Exam  Constitutional: She appears well-developed and well-nourished. No distress.  HENT:  Head: Normocephalic and atraumatic.  Mouth/Throat: Uvula is midline, oropharynx is clear and moist and mucous membranes are normal. Oral lesions:   shallow ulceration on Right side of tongue.  Eyes: Conjunctivae are normal. No scleral icterus.  Neck: Normal range of motion. Neck supple.  Cardiovascular: Normal rate, regular rhythm and normal heart sounds.   Pulmonary/Chest: Effort normal and breath sounds normal. No respiratory distress. She has no wheezes. She has no rales. She exhibits tenderness. Left breast exhibits skin change and tenderness. Left breast exhibits no inverted nipple, no mass and no nipple discharge.    Left breast: 1cm area of erythema on areole. Tender to touch. No nipple discharge. No masses.   Abdominal: Soft. Bowel sounds are normal. She exhibits no distension and no mass. There is tenderness. There is CVA tenderness ( Right). There is no rebound and no guarding.  Musculoskeletal: Normal range of motion.  Neurological: She is alert.  Skin: Skin is warm and dry. She is not diaphoretic.  Nursing note and vitals reviewed.   ED Course  Procedures (including critical care time) Labs Review Labs Reviewed  CBC WITH DIFFERENTIAL/PLATELET - Abnormal; Notable for the following:    RBC 3.49 (*)    Hemoglobin 10.4 (*)    HCT 31.2 (*)  All other components within normal limits  COMPREHENSIVE METABOLIC PANEL - Abnormal; Notable for the following:    ALT 10 (*)    All other components within normal limits  LIPASE, BLOOD - Abnormal; Notable for the following:    Lipase 20 (*)    All other components within normal limits  URINALYSIS, ROUTINE W REFLEX MICROSCOPIC (NOT AT [facility])  PREGNANCY, URINE    Imaging Review Ct Renal Stone Study  08/22/2014   CLINICAL DATA:  Kidney infection with persistent right lower quadrant abdominal pain which radiates to the flank.  EXAM: CT ABDOMEN AND PELVIS WITHOUT CONTRAST  TECHNIQUE: Multidetector CT imaging of the abdomen and pelvis was performed following the standard protocol without IV contrast.  COMPARISON:  Abdominal ultrasound - 08/16/2014  FINDINGS: The lack of intravenous  contrast limits the ability to evaluate solid abdominal organs.  Normal hepatic contour. Normal noncontrast appearance of the gallbladder. No radiopaque gallstones. No ascites.  Normal noncontrast appearance of the bilateral kidneys. No renal stones. No renal stones are seen along the expected course of either ureter or the urinary bladder. Normal noncontrast appearance of the urinary bladder given degree distention. No urinary obstruction or perinephric stranding.  Normal noncontrast appearance of the bilateral adrenal glands, pancreas and spleen. Incidental note is made of a small splenule.  Moderate to large colonic stool burden without evidence of enteric obstruction. There is a minimal amount of high-density material within otherwise normal-appearing retrocecal appendix. No pericecal stranding. Normal noncontrast appearance of the terminal ileum. No pneumoperitoneum, pneumatosis or portal venous gas.  Normal caliber of the abdominal aorta.  No bulky retroperitoneal, pelvic or inguinal lymphadenopathy on this noncontrast examination.  Note is made of an approximately 1.7 x 2.3 cm hypo attenuating right-sided adnexal lesion, presumably physiologic cyst. No discrete left-sided adnexal lesions. No free fluid in the pelvic cul-de-sac.  Limited visualization of lower thorax is negative for focal airspace opacity or pleural effusion. Normal heart size. No pericardial effusion.  No acute or aggressive osseous abnormalities.  Regional soft tissues appear normal.  IMPRESSION: 1. No explanation for patient's worsening right lower quadrant and flank pain. Specifically, no evidence of nephrolithiasis, enteric or urinary obstruction. 2. Note made of a approximately 2.3 cm hypo attenuating right-sided adnexal lesion, incompletely characterized on this noncontrast examination though favored to represent a physiologic cyst.   Electronically Signed   By: Simonne Come M.D.   On: 08/22/2014 17:05     EKG Interpretation None        MDM   Final diagnoses:  Right sided abdominal pain  Right flank pain  Behcet's syndrome   Pt is a [place] female with hx of Bahcet's syndrome and fibromyalgia, presenting to ED with c/o gradually worsening Right flank pain.  Pt recently tx for pyelonpehritis.  Concern for renal stone as pt still in significant pain.  Labs: unremarkable, UA-normal CT stone study: no explanation for pt's worsening Right lower quadrant and flank pain.    Discussed pt with Dr. Patria Mane who also examined pt, pt to be given additional dilaudid in ED, will be discharged home with 2 days of  prednisone and have f/u with PCP and establish care with an endocrinologist.   7:03 PM Pt states pain is improving and she feels safe being discharged home. Return precautions provided. Pt verbalized understanding and agreement with tx plan.    Junius Finner, PA-C 08/22/14 1950  Azalia Bilis, MD 08/23/14 [phone]

## 2014-08-22 NOTE — Progress Notes (Signed)
EDCM spoke to patient and family member at bedside.  [facility]  She was seen there in the past because she did not have her Medicaid insurance at that time.  Patient reports she now has Medicaid.  [facility]  [facility] and was told if she was having more pain to go to the ED or [facility]  [facility] Bec[facility]'s [facility].  EDP entered room at this time.  No further EDCM needs at this time.

## 2014-08-22 NOTE — ED Notes (Signed)
Pt states that she was seen here last week for kidney infection but still in a lot of pain on RLQ that radiates to right flank area.  Pt states that she taking the antibiotics as ordered.  Pt states that on Monday she had transfusion that her body rejected and been having n/v since. Pt also has an ulcer on her left breast that she would like checked out while she is here.

## 2014-12-17 ENCOUNTER — Encounter (HOSPITAL_COMMUNITY): Payer: Self-pay | Admitting: *Deleted

## 2014-12-17 ENCOUNTER — Emergency Department (HOSPITAL_COMMUNITY)
Admission: EM | Admit: 2014-12-17 | Discharge: 2014-12-18 | Disposition: A | Discharge status: Other Acute Inpatient Hospital | Payer: Medicaid Other | Attending: Emergency Medicine | Admitting: Emergency Medicine

## 2014-12-17 DIAGNOSIS — J45909 Unspecified asthma, uncomplicated: Secondary | ICD-10-CM | POA: Diagnosis not present

## 2014-12-17 DIAGNOSIS — Z79899 Other long term (current) drug therapy: Secondary | ICD-10-CM | POA: Insufficient documentation

## 2014-12-17 DIAGNOSIS — F1310 Sedative, hypnotic or anxiolytic abuse, uncomplicated: Secondary | ICD-10-CM | POA: Insufficient documentation

## 2014-12-17 DIAGNOSIS — F329 Major depressive disorder, single episode, unspecified: Secondary | ICD-10-CM | POA: Insufficient documentation

## 2014-12-17 DIAGNOSIS — Z8739 Personal history of other diseases of the musculoskeletal system and connective tissue: Secondary | ICD-10-CM | POA: Diagnosis not present

## 2014-12-17 DIAGNOSIS — F332 Major depressive disorder, recurrent severe without psychotic features: Secondary | ICD-10-CM | POA: Diagnosis not present

## 2014-12-17 DIAGNOSIS — R45851 Suicidal ideations: Secondary | ICD-10-CM

## 2014-12-17 DIAGNOSIS — Z8679 Personal history of other diseases of the circulatory system: Secondary | ICD-10-CM | POA: Insufficient documentation

## 2014-12-17 DIAGNOSIS — E079 Disorder of thyroid, unspecified: Secondary | ICD-10-CM | POA: Insufficient documentation

## 2014-12-17 DIAGNOSIS — F322 Major depressive disorder, single episode, severe without psychotic features: Secondary | ICD-10-CM

## 2014-12-17 DIAGNOSIS — Z3202 Encounter for pregnancy test, result negative: Secondary | ICD-10-CM | POA: Insufficient documentation

## 2014-12-17 LAB — COMPREHENSIVE METABOLIC PANEL
ALT: 10 U/L — ABNORMAL LOW (ref 14–54)
ANION GAP: 5 (ref 5–15)
AST: 16 U/L (ref 15–41)
Albumin: 4.3 g/dL (ref 3.5–5.0)
Alkaline Phosphatase: 58 U/L (ref 38–126)
BUN: 11 mg/dL (ref 6–20)
CALCIUM: 9.1 mg/dL (ref 8.9–10.3)
CHLORIDE: 108 mmol/L (ref 101–111)
CO2: 22 mmol/L (ref 22–32)
Creatinine, Ser: 0.53 mg/dL (ref 0.44–1.00)
GFR calc Af Amer: >60 mL/min (ref >60)
GFR calc non Af Amer: >60 mL/min (ref >60)
Glucose, Bld: 89 mg/dL (ref 65–99)
Potassium: 3.6 mmol/L (ref 3.5–5.1)
SODIUM: 135 mmol/L (ref 135–145)
Total Bilirubin: 1.1 mg/dL (ref 0.3–1.2)
Total Protein: 8.1 g/dL (ref 6.5–8.1)

## 2014-12-17 LAB — CBC
HCT: 31.4 % — ABNORMAL LOW (ref 36.0–46.0)
HEMOGLOBIN: 11 g/dL — AB (ref 12.0–15.0)
MCH: 30.7 pg (ref 26.0–34.0)
MCHC: 35 g/dL (ref 30.0–36.0)
MCV: 87.7 fL (ref 78.0–100.0)
Platelets: 333 10*3/uL (ref 150–400)
RBC: 3.58 MIL/uL — AB (ref 3.87–5.11)
RDW: 15.5 % (ref 11.5–15.5)
WBC: 8.2 10*3/uL (ref 4.0–10.5)

## 2014-12-17 LAB — RAPID URINE DRUG SCREEN, HOSP PERFORMED
Amphetamines: NOT DETECTED
Barbiturates: NOT DETECTED
Benzodiazepines: POSITIVE — AB
Cocaine: NOT DETECTED
OPIATES: NOT DETECTED
TETRAHYDROCANNABINOL: NOT DETECTED

## 2014-12-17 LAB — PREGNANCY, URINE: Preg Test, Ur: NEGATIVE

## 2014-12-17 LAB — ACETAMINOPHEN LEVEL

## 2014-12-17 LAB — ETHANOL: Alcohol, Ethyl (B): <5 mg/dL (ref <5)

## 2014-12-17 LAB — SALICYLATE LEVEL

## 2014-12-17 MED ORDER — PREDNISONE 20 MG PO TABS
40.0000 mg | ORAL_TABLET | Freq: Once | ORAL | Status: AC
  Administered 2014-12-17: 40 mg via ORAL
  Filled 2014-12-17: qty 2

## 2014-12-17 MED ORDER — LEVOTHYROXINE SODIUM 100 MCG PO TABS
100.0000 ug | ORAL_TABLET | Freq: Every day | ORAL | Status: DC
  Administered 2014-12-18: 100 ug via ORAL
  Filled 2014-12-17 (×2): qty 1

## 2014-12-17 MED ORDER — MONTELUKAST SODIUM 10 MG PO TABS
10.0000 mg | ORAL_TABLET | Freq: Every day | ORAL | Status: DC
  Administered 2014-12-17: 10 mg via ORAL
  Filled 2014-12-17 (×2): qty 1

## 2014-12-17 MED ORDER — PRAZOSIN HCL 1 MG PO CAPS
1.0000 mg | ORAL_CAPSULE | Freq: Every day | ORAL | Status: DC
Start: 2014-12-17 — End: 2014-12-18
  Administered 2014-12-17: 1 mg via ORAL
  Filled 2014-12-17 (×2): qty 1

## 2014-12-17 MED ORDER — HYDROXYZINE HCL 25 MG PO TABS
50.0000 mg | ORAL_TABLET | Freq: Four times a day (QID) | ORAL | Status: DC | PRN
Start: 2014-12-17 — End: 2014-12-18
  Administered 2014-12-17 – 2014-12-18 (×2): 50 mg via ORAL
  Filled 2014-12-17 (×2): qty 2

## 2014-12-17 MED ORDER — AZATHIOPRINE 50 MG PO TABS
150.0000 mg | ORAL_TABLET | Freq: Every day | ORAL | Status: DC
  Administered 2014-12-17 – 2014-12-18 (×2): 150 mg via ORAL
  Filled 2014-12-17 (×2): qty 3

## 2014-12-17 MED ORDER — HYDROXYZINE HCL 25 MG PO TABS
25.0000 mg | ORAL_TABLET | Freq: Four times a day (QID) | ORAL | Status: DC | PRN
  Administered 2014-12-17: 25 mg via ORAL
  Filled 2014-12-17: qty 1

## 2014-12-17 MED ORDER — ZOLPIDEM TARTRATE 10 MG PO TABS
10.0000 mg | ORAL_TABLET | Freq: Every day | ORAL | Status: DC
  Administered 2014-12-17: 10 mg via ORAL
  Filled 2014-12-17: qty 1

## 2014-12-17 MED ORDER — FLUOXETINE HCL 20 MG PO CAPS
20.0000 mg | ORAL_CAPSULE | Freq: Every day | ORAL | Status: DC
  Administered 2014-12-17 – 2014-12-18 (×2): 20 mg via ORAL
  Filled 2014-12-17 (×2): qty 1

## 2014-12-17 NOTE — ED Provider Notes (Signed)
CSN: [id number]     Arrival date & time 12/17/14  1503 History   First MD Initiated Contact with Patient 12/17/14 1552     Chief Complaint  Patient presents with  . Suicidal      The history is provided by the patient. No language interpreter was used.   Ashley Anthony presents for evaluation of side. She states she has a long going history of suicidal thoughts. She has access to her stepfather's gun and today she had the glock and her hand with intent to kill herself. She was distracted by the dog and decided to call 911 instead. She has multiple medical issues that are reasonably well controlled but she states these cause her a lot of stress. She does feel some joint stiffness and is concerned that she may be developing ulcers c/w behcets flare.  Past Medical History  Diagnosis Date  . Fibromyalgia   . Autoimmune disorder (HCC)   . Thyroid disease   . Asthma   . Migraine   . Behcet's syndrome (HCC)    History reviewed. No pertinent past surgical history. Family History  Problem Relation Age of Onset  . Heart disease Father   . Cancer Maternal Aunt   . Diabetes Maternal Grandmother   . Diabetes Paternal Grandfather    Social History  Substance Use Topics  . Smoking status: Never Smoker   . Smokeless tobacco: Never Used  . Alcohol Use: No   OB History    No data available     Review of Systems  All other systems reviewed and are negative.     Allergies  Remicade  Home Medications   Prior to Admission medications   Medication Sig Start Date End Date Taking? Authorizing Provider  albuterol (PROVENTIL HFA;VENTOLIN HFA) 108 (90 BASE) MCG/ACT inhaler Inhale 2 puffs into the lungs every 6 (six) hours as needed for wheezing or shortness of breath.   Yes Historical Provider, MD  azaTHIOprine (IMURAN) 50 MG tablet Take 150 mg by mouth daily.   Yes Historical Provider, MD  clonazePAM (KLONOPIN) 0.5 MG tablet TK 1 T PO QHS 11/27/14  Yes Historical Provider, MD  FLUoxetine  (PROZAC) 10 MG capsule TK 1 C PO QD 11/27/14  Yes Historical Provider, MD  levothyroxine (SYNTHROID, LEVOTHROID) 100 MCG tablet Take 1 tablet (100 mcg total) by mouth daily before breakfast. 06/14/14  Yes Vivianne Master, PA-C  methylphenidate 27 MG PO CR tablet TK 1 T PO QAM 12/03/14  Yes Historical Provider, MD  montelukast (SINGULAIR) 10 MG tablet TK 1 T PO QHS 11/27/14  Yes Historical Provider, MD  prazosin (MINIPRESS) 1 MG capsule TK 1 TO 2 CS PO QHS 12/03/14  Yes Historical Provider, MD  predniSONE (DELTASONE) 20 MG tablet Take 3 tablets (60 mg total) by mouth daily with breakfast. For 2 days Patient taking differently: Take 10 mg by mouth daily as needed (inflammation).  08/22/14  Yes Junius Finner, PA-C  zolpidem (AMBIEN) 10 MG tablet TK 1 PO QHS PRN 11/22/14  Yes Historical Provider, MD  Alum & Mag Hydroxide-Simeth (MAGIC MOUTHWASH W/LIDOCAINE) SOLN Take 5 mLs by mouth 3 (three) times daily. Patient not taking: Reported on 12/17/2014 06/14/14   Vivianne Master, PA-C  azathioprine (IMURAN) 100 MG tablet Take 1 tablet (100 mg total) by mouth daily. Patient not taking: Reported on 08/08/2014 06/14/14   Vivianne Master, PA-C  cephALEXin (KEFLEX) 500 MG capsule Take 1 capsule (500 mg total) by mouth 4 (four) times daily. Patient  not taking: Reported on 12/17/2014 08/16/14   Jaynie Crumble, PA-C  HYDROcodone-acetaminophen (NORCO/VICODIN) 5-325 MG per tablet Take 1-2 tablets by mouth every 4 (four) hours as needed. Patient not taking: Reported on 12/17/2014 08/16/14   Jaynie Crumble, PA-C  omeprazole (PRILOSEC) 20 MG capsule Take 1 capsule (20 mg total) by mouth daily. Patient not taking: Reported on 12/17/2014 08/08/14   Azalia Bilis, MD  ondansetron [facility] ODT) 8 MG disintegrating tablet Take 1 tablet (8 mg total) by mouth every 8 (eight) hours as needed for nausea or vomiting. Patient not taking: Reported on 12/17/2014 08/16/14   Tatyana Kirichenko, PA-C  ondansetron (ZOFRAN) 4 MG tablet Take 1 tablet (4 mg  total) by mouth every 8 (eight) hours as needed for nausea or vomiting. Patient not taking: Reported on 08/22/2014 06/14/14   Vivianne Master, PA-C  oxyCODONE-acetaminophen (PERCOCET/ROXICET) 5-325 MG per tablet Take 1-2 tablets by mouth every 6 (six) hours as needed for severe pain. Patient not taking: Reported on 12/17/2014 08/22/14   Junius Finner, PA-C  predniSONE (DELTASONE) 20 MG tablet Take 2 tablets (40 mg total) by mouth once. Patient not taking: Reported on 08/16/2014 07/20/14   Earley Favor, NP   BP 102/63 mmHg  Pulse 92  Temp(Src) 98.5 F (36.9 C) (Oral)  Resp 16  SpO2 100%  LMP 11/17/2014 Physical Exam  Constitutional: She is oriented to person, place, and time. She appears well-developed and well-nourished.  HENT:  Head: Normocephalic and atraumatic.  Cardiovascular: Normal rate and regular rhythm.   Pulmonary/Chest: Effort normal. No respiratory distress.  Musculoskeletal: She exhibits no edema or tenderness.  Neurological: She is alert and oriented to person, place, and time.  Skin: Skin is warm and dry.  Psychiatric:  Flat affect  Nursing note and vitals reviewed.   ED Course  Procedures (including critical care time) Labs Review Labs Reviewed  COMPREHENSIVE METABOLIC PANEL - Abnormal; Notable for the following:    ALT 10 (*)    All other components within normal limits  ACETAMINOPHEN LEVEL - Abnormal; Notable for the following:    Acetaminophen (Tylenol), Serum <10 (*)    All other components within normal limits  CBC - Abnormal; Notable for the following:    RBC 3.58 (*)    Hemoglobin 11.0 (*)    HCT 31.4 (*)    All other components within normal limits  URINE RAPID DRUG SCREEN, HOSP PERFORMED - Abnormal; Notable for the following:    Benzodiazepines POSITIVE (*)    All other components within normal limits  ETHANOL  SALICYLATE LEVEL  PREGNANCY, URINE    Imaging Review No results found. I have personally reviewed and evaluated these images and lab results  as part of my medical decision-making.   EKG Interpretation None      MDM   Final diagnoses:  Suicidal ideation    Patient here with SI, had a gun in her hand and threatened to kill her self today. She didn't seek help for this attempt. She does have a history of autoimmune conditions and is on intermittent steroids at times, she feels as if she may be developing a flare, treated with one-time dose of prednisone. There is no evidence of acute infection. Patient has been medically cleared for psychiatric evaluation.  Tilden Fossa, MD 12/18/14 [phone]

## 2014-12-17 NOTE — ED Notes (Signed)
Patient has been pleasant, but somewhat tearful since arriving on the unit.  She is very flat and sad.  States the only thing that stopped her from pulling the trigger today was her puppy.  She did not want the dog to be involved in any mess there would be.  She called 911 herself and asked for help.  She is taking fluids and visiting with family.  States she is discouraged and upset with her medical problems and is tired of being sick and on all kinds of medications.  Agrees to an admission if that is what is recommended.

## 2014-12-17 NOTE — BH Assessment (Signed)
Tele Assessment Note   Ashley Anthony is an 19 y.o. female who called 911 after holding a loaded gun with the intention of killing herself. She states that her step father has a gun in the house where she lives and she had access to it. She states that she has an auto-immune disease which has been a big stressor for her and she has been depressed on and off for a few years. She states that her depression has gotten worse in the past 2 months and she went to see a psychiatrist Alvino [facility]) 2 weeks ago and was prescribed prozac. She states that she is also experiencing anxiety and sometimes thinks that people are going to come kill her in her sleep. She states that she doesn't sleep well and only gets about 5 hours of sleep a night. She also states that she hasn't had an appetite lately and has lost 5 pounds. She states that she has a history of cutting but has never been admitted inpatient in a psychiatric hospital. She denies HI, A/V hallucinations or a history of substance abuse. She currently lives with her mom and step father and works full time which is another stressor because she has to call out often due to her health condition. Client has an initial appointment with a therapist at [facility] for tomorrow.   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 Fibromyalgia   . Autoimmune disorder (HCC)   . Thyroid disease   . Asthma   . Migraine   . Behcet's syndrome (HCC)     History reviewed. No pertinent past surgical history.  Family History:  Family History  Problem Relation Age of Onset  . Heart disease Father   . Cancer Maternal Aunt   . Diabetes Maternal Grandmother   . Diabetes Paternal Grandfather     Social History:  reports that she has never smoked. She has never used smokeless tobacco. She reports that she does not  drink alcohol or use illicit drugs.  Additional Social History:  Alcohol / Drug Use History of alcohol / drug use?: No history of alcohol / drug abuse  CIWA: CIWA-Ar BP: 102/63 mmHg Pulse Rate: 92 COWS:    PATIENT STRENGTHS: (choose at least two) Average or above average intelligence General fund of knowledge Supportive family/friends  Allergies:  Allergies  Allergen Reactions  . Remicade [Infliximab] Anaphylaxis    Home Medications:  (Not in a hospital admission)  OB/GYN Status:  Patient's last menstrual period was 11/17/2014.  General Assessment Data Location of Assessment: [facility] TTS Assessment: In system Is this a Tele or Face-to-Face Assessment?: Tele Assessment Is this an Initial Assessment or a Re-assessment for this encounter?: Initial Assessment Marital status: Single Is patient pregnant?: No Pregnancy Status: No Living Arrangements: Parent Can pt return to current living arrangement?: Yes Admission Status: Voluntary Is patient capable of signing voluntary admission?: Yes Referral Source: Self/Family/Friend Insurance type: Medicaid      Crisis Care Plan Living Arrangements: Parent Name of Psychiatrist: Oneta Rack Name of Therapist: Gunnar Fusi at [facility]- appointment tomorrow   Education Status Is patient currently in school?: No Current Grade: N/A  Highest grade of school patient has completed: 12th Name of school: N/A  Contact person: N/A   Risk to self with the past 6 months Suicidal Ideation: Yes-Currently Present Has patient been a  risk to self within the past 6 months prior to admission? : Yes Suicidal Intent: Yes-Currently Present Has patient had any suicidal intent within the past 6 months prior to admission? : Yes Is patient at risk for suicide?: Yes Suicidal Plan?: Yes-Currently Present Has patient had any suicidal plan within the past 6 months prior to admission? : Yes Specify Current Suicidal Plan: shoot self with a gun Access to  Means: Yes Specify Access to Suicidal Means: step father has a gun in the home where she lives What has been your use of drugs/alcohol within the last 12 months?: Denies use Previous Attempts/Gestures: Yes How many times?: 1 Other Self Harm Risks: history of cutting Triggers for Past Attempts: Unpredictable Intentional Self Injurious Behavior: Cutting Comment - Self Injurious Behavior: history of cutting Family Suicide History: Yes Recent stressful life event(s): Recent negative physical changes Persecutory voices/beliefs?: No Depression: Yes Depression Symptoms: Despondent, Insomnia, Fatigue, Loss of interest in usual pleasures, Feeling worthless/self pity Substance abuse history and/or treatment for substance abuse?: No Suicide prevention information given to non-admitted patients: Not applicable  Risk to Others within the past 6 months Homicidal Ideation: No Does patient have any lifetime risk of violence toward others beyond the six months prior to admission? : No Thoughts of Harm to Others: No Current Homicidal Intent: No Current Homicidal Plan: No Access to Homicidal Means: No Identified Victim: none History of harm to others?: No Assessment of Violence: None Noted Violent Behavior Description: none Does patient have access to weapons?: Yes (Comment) Criminal Charges Pending?: No Does patient have a court date: No Is patient on probation?: No  Psychosis Hallucinations: None noted Delusions: None noted  Mental Status Report Appearance/Hygiene: In scrubs Eye Contact: Good Motor Activity: Freedom of movement Speech: Logical/coherent Level of Consciousness: Alert Mood: Depressed Affect: Blunted, Depressed Anxiety Level: Minimal Thought Processes: Coherent Judgement: Impaired Orientation: Person, Place, Time, Situation Obsessive Compulsive Thoughts/Behaviors: Unable to Assess  Cognitive Functioning Concentration: Decreased Memory: Recent Intact, Remote  Intact IQ: Average Insight: Fair Impulse Control: Fair Appetite: Poor Weight Loss: 5 Weight Gain: 0 Sleep: Decreased Total Hours of Sleep: 5 Vegetative Symptoms: Unable to Assess  ADLScreening [facility] Assessment Services) Patient's cognitive ability adequate to safely complete daily activities?: Yes Patient able to express need for assistance with ADLs?: Yes Independently performs ADLs?: Yes (appropriate for developmental age)  Prior Inpatient Therapy Prior Inpatient Therapy: No  Prior Outpatient Therapy Prior Outpatient Therapy: Yes Prior Therapy Dates: 2 weeks ago Prior Therapy Facilty/Provider(s): [facility]  Reason for Treatment: medication management Does patient have an ACCT team?: No Does patient have Intensive In-House Services?  : No Does patient have [facility] services? : No Does patient have P4CC services?: No  ADL Screening (condition at time of admission) Patient's cognitive ability adequate to safely complete daily activities?: Yes Is the patient deaf or have difficulty hearing?: No Does the patient have difficulty seeing, even when wearing glasses/contacts?: No Does the patient have difficulty concentrating, remembering, or making decisions?: No Patient able to express need for assistance with ADLs?: Yes Does the patient have difficulty dressing or bathing?: No Independently performs ADLs?: Yes (appropriate for developmental age) Does the patient have difficulty walking or climbing stairs?: No Weakness of Legs: None Weakness of Arms/Hands: None  Home Assistive Devices/Equipment Home Assistive Devices/Equipment: None  Therapy Consults (therapy consults require a physician order) PT Evaluation Needed: No OT Evalulation Needed: No SLP Evaluation Needed: No Abuse/Neglect Assessment (Assessment to be complete while patient is alone) Physical Abuse: Denies Verbal  Abuse: Denies Sexual Abuse: Denies Exploitation of patient/patient's resources:  Denies Self-Neglect: Denies Values / Beliefs Cultural Requests During Hospitalization: None Spiritual Requests During Hospitalization: None Consults Spiritual Care Consult Needed: No Social Work Consult Needed: No Merchant [employer] officer (For Healthcare) Does patient have an advance directive?: No Would patient like information on creating an advanced directive?: No - patient declined information    Additional Information 1:1 In Past 12 Months?: No CIRT Risk: No Elopement Risk: No Does patient have medical clearance?: Yes     Disposition:  Disposition Initial Assessment Completed for this Encounter: Yes Disposition of Patient: Inpatient treatment program Type of inpatient treatment program: Adult  Benjy Kana 12/17/2014 5:08 PM

## 2014-12-17 NOTE — ED Notes (Signed)
Pt AAO x 3, visiting with mother at present, denies SI at present. Calm & cooperative, no distress noted, slightly anxious, monitoring for safety, Q 15 min checks in effect.

## 2014-12-17 NOTE — ED Notes (Signed)
Per EMS, pt from home, reports SI with her step-dad's gun which she has an access to.  Pt is depressed d/t her medical condition.  Has attempted SA with OD in the past.

## 2014-12-18 ENCOUNTER — Encounter (HOSPITAL_COMMUNITY): Payer: Self-pay | Admitting: General Practice

## 2014-12-18 ENCOUNTER — Inpatient Hospital Stay (HOSPITAL_COMMUNITY)
Admission: AD | Admit: 2014-12-18 | Discharge: 2014-12-22 | DRG: 885 | Disposition: A | Discharge status: Home / Self Care | Payer: Medicaid Other | Source: Intra-hospital | Attending: Psychiatry | Admitting: Psychiatry

## 2014-12-18 DIAGNOSIS — R45851 Suicidal ideations: Secondary | ICD-10-CM

## 2014-12-18 DIAGNOSIS — F322 Major depressive disorder, single episode, severe without psychotic features: Secondary | ICD-10-CM | POA: Diagnosis present

## 2014-12-18 DIAGNOSIS — Z915 Personal history of self-harm: Secondary | ICD-10-CM

## 2014-12-18 DIAGNOSIS — E063 Autoimmune thyroiditis: Secondary | ICD-10-CM | POA: Diagnosis present

## 2014-12-18 DIAGNOSIS — F332 Major depressive disorder, recurrent severe without psychotic features: Secondary | ICD-10-CM

## 2014-12-18 DIAGNOSIS — Z79899 Other long term (current) drug therapy: Secondary | ICD-10-CM

## 2014-12-18 DIAGNOSIS — F329 Major depressive disorder, single episode, unspecified: Secondary | ICD-10-CM | POA: Diagnosis not present

## 2014-12-18 DIAGNOSIS — M352 Behcet's disease: Secondary | ICD-10-CM | POA: Diagnosis present

## 2014-12-18 MED ORDER — ALUM & MAG HYDROXIDE-SIMETH 200-200-20 MG/5ML PO SUSP: 30.0000 mL | ORAL | Status: DC | PRN

## 2014-12-18 MED ORDER — LEVOTHYROXINE SODIUM 100 MCG PO TABS
100.0000 ug | ORAL_TABLET | Freq: Every day | ORAL | Status: DC
  Administered 2014-12-19 – 2014-12-22 (×4): 100 ug via ORAL
  Filled 2014-12-18 (×7): qty 1

## 2014-12-18 MED ORDER — INFLUENZA VAC SPLIT QUAD 0.5 ML IM SUSY
0.5000 mL | PREFILLED_SYRINGE | INTRAMUSCULAR | Status: AC
  Administered 2014-12-19: 0.5 mL via INTRAMUSCULAR
  Filled 2014-12-18: qty 0.5

## 2014-12-18 MED ORDER — IBUPROFEN 200 MG PO TABS: 600.0000 mg | ORAL_TABLET | Freq: Four times a day (QID) | ORAL | Status: DC | PRN

## 2014-12-18 MED ORDER — ACETAMINOPHEN 325 MG PO TABS
650.0000 mg | ORAL_TABLET | Freq: Four times a day (QID) | ORAL | Status: DC | PRN
  Administered 2014-12-19 – 2014-12-20 (×2): 650 mg via ORAL
  Filled 2014-12-18 (×2): qty 2

## 2014-12-18 MED ORDER — ZOLPIDEM TARTRATE 5 MG PO TABS
5.0000 mg | ORAL_TABLET | Freq: Every day | ORAL | Status: DC
  Administered 2014-12-18 – 2014-12-20 (×3): 5 mg via ORAL
  Filled 2014-12-18 (×3): qty 1

## 2014-12-18 MED ORDER — IBUPROFEN 800 MG PO TABS
800.0000 mg | ORAL_TABLET | Freq: Once | ORAL | Status: AC
  Administered 2014-12-18: 800 mg via ORAL
  Filled 2014-12-18: qty 1

## 2014-12-18 MED ORDER — MONTELUKAST SODIUM 10 MG PO TABS
10.0000 mg | ORAL_TABLET | Freq: Every day | ORAL | Status: DC
  Administered 2014-12-18 – 2014-12-21 (×4): 10 mg via ORAL
  Filled 2014-12-18 (×8): qty 1

## 2014-12-18 MED ORDER — IBUPROFEN 600 MG PO TABS
600.0000 mg | ORAL_TABLET | Freq: Four times a day (QID) | ORAL | Status: DC | PRN
  Administered 2014-12-18 – 2014-12-22 (×7): 600 mg via ORAL
  Filled 2014-12-18 (×7): qty 1

## 2014-12-18 MED ORDER — HYDROXYZINE HCL 50 MG PO TABS
50.0000 mg | ORAL_TABLET | Freq: Four times a day (QID) | ORAL | Status: DC | PRN
  Administered 2014-12-18 – 2014-12-21 (×6): 50 mg via ORAL
  Filled 2014-12-18 (×6): qty 1

## 2014-12-18 MED ORDER — FLUOXETINE HCL 20 MG PO CAPS
20.0000 mg | ORAL_CAPSULE | Freq: Every day | ORAL | Status: DC
  Administered 2014-12-19 – 2014-12-22 (×4): 20 mg via ORAL
  Filled 2014-12-18 (×6): qty 1

## 2014-12-18 MED ORDER — MAGNESIUM HYDROXIDE 400 MG/5ML PO SUSP: 30.0000 mL | Freq: Every day | ORAL | Status: DC | PRN

## 2014-12-18 MED ORDER — PRAZOSIN HCL 1 MG PO CAPS
1.0000 mg | ORAL_CAPSULE | Freq: Every day | ORAL | Status: DC
  Administered 2014-12-18 – 2014-12-21 (×4): 1 mg via ORAL
  Filled 2014-12-18 (×8): qty 1

## 2014-12-18 MED ORDER — AZATHIOPRINE 50 MG PO TABS
150.0000 mg | ORAL_TABLET | Freq: Every day | ORAL | Status: DC
  Administered 2014-12-19 – 2014-12-22 (×4): 150 mg via ORAL
  Filled 2014-12-18 (×6): qty 3

## 2014-12-18 NOTE — Consult Note (Signed)
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 Severe major depression, single episode, without psychotic features ([place]) [F32.2] 12/18/2014    Priority: High    Class: Acute  . 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 Thyroid activity decreased [E03.9] 06/27/2014  . Chronic pain syndrome [G89.4] 06/27/2014  . Depression [F32.9] 06/27/2014  . Attention deficit hyperactivity disorder [F90.9] 06/27/2014  . Insomnia [G47.00] 06/27/2014    Total Time spent with patient: 45 minutes  Subjective:   Ashley Anthony is a 19 y.o. female patient admitted with depression and suicidal threats.  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  She has no life, cannot look forward to any thing and cannot do much physically.  Has no hope that anything will change in the future.  Yesterday things just built up and she felt there was no point in living any longer.  She had a gun to her head with the intent of killing herself but cane here instead because she needs help.  Past Psychiatric History: none  Risk to Self: Suicidal Ideation: Yes-Currently Present Suicidal Intent: Yes-Currently Present Is patient at risk for suicide?: Yes Suicidal Plan?: Yes-Currently Present Specify Current Suicidal Plan: shoot self with a gun Access to Means: Yes Specify Access to Suicidal Means: step father has a gun in the home where she lives What has been your use of drugs/alcohol within the last 12 months?: Denies use How many times?: 1 Other Self Harm Risks: history of cutting Triggers for Past Attempts:  Unpredictable Intentional Self Injurious Behavior: Cutting Comment - Self Injurious Behavior: history of cutting Risk to Others: Homicidal Ideation: No Thoughts of Harm to Others: No Current Homicidal Intent: No Current Homicidal Plan: No Access to Homicidal Means: No Identified Victim: none History of harm to others?: No Assessment of Violence: None Noted Violent Behavior Description: none Does patient have access to weapons?: Yes (Comment) Criminal Charges Pending?: No Does patient have a court date: No Prior Inpatient Therapy: Prior Inpatient Therapy: No Prior Outpatient Therapy: Prior Outpatient Therapy: Yes Prior Therapy Dates: 2 weeks ago Prior Therapy Facilty/Provider(s): [facility]  Reason for Treatment: medication management Does patient have an ACCT team?: No Does patient have Intensive In-House Services?  : No Does patient have [facility] services? : No Does patient have P4CC services?: No  Past Medical History:  Past Medical History  Diagnosis Date  . Fibromyalgia   . Autoimmune disorder ([place])   . Thyroid disease   . Asthma   . Migraine   . Behcet's syndrome ([place])    History reviewed. No pertinent past surgical history. Family History:  Family History  Problem Relation Age of Onset  . Heart disease Father   . Cancer Maternal Aunt   . Diabetes Maternal Grandmother   . Diabetes Paternal Grandfather    Family Psychiatric  History: none Social History:  History  Alcohol Use No     History  Drug Use No    Social History   Social History  . Marital Status: Single    Spouse Name: N/A  . Number of Children: N/A  . Years of Education: N/A   Social History Main Topics  .  Smoking status: Never Smoker   . Smokeless tobacco: Never Used  . Alcohol Use: No  . Drug Use: No  . Sexual Activity: Not Asked   Other Topics Concern  . None   Social History Narrative   Additional Social History:    History of alcohol / drug use?: No history of alcohol  / drug abuse                     Allergies:   Allergies  Allergen Reactions  . Remicade [Infliximab] Anaphylaxis    Labs:  Results for orders placed or performed during the hospital encounter of 12/17/14 (from the past 48 hour(s))  Urine rapid drug screen (hosp performed) (Not at [facility])     Status: Abnormal   Collection Time: 12/17/14  3:29 PM  Result Value Ref Range   Opiates NONE DETECTED NONE DETECTED   Cocaine NONE DETECTED NONE DETECTED   Benzodiazepines POSITIVE (A) NONE DETECTED   Amphetamines NONE DETECTED NONE DETECTED   Tetrahydrocannabinol NONE DETECTED NONE DETECTED   Barbiturates NONE DETECTED NONE DETECTED    Comment:        DRUG SCREEN FOR MEDICAL PURPOSES ONLY.  IF CONFIRMATION IS NEEDED FOR ANY PURPOSE, NOTIFY LAB WITHIN 5 DAYS.        LOWEST DETECTABLE LIMITS FOR URINE DRUG SCREEN Drug Class       Cutoff (ng/mL) Amphetamine      1000 Barbiturate      200 Benzodiazepine   811 Tricyclics       914 Opiates          300 Cocaine          300 THC              50   Pregnancy, urine     Status: None   Collection Time: 12/17/14  3:29 PM  Result Value Ref Range   Preg Test, Ur NEGATIVE NEGATIVE    Comment:        THE SENSITIVITY OF THIS METHODOLOGY IS >20 mIU/mL.   Comprehensive metabolic panel     Status: Abnormal   Collection Time: 12/17/14  3:35 PM  Result Value Ref Range   Sodium 135 135 - 145 mmol/L   Potassium 3.6 3.5 - 5.1 mmol/L   Chloride 108 101 - 111 mmol/L   CO2 22 22 - 32 mmol/L   Glucose, Bld 89 65 - 99 mg/dL   BUN 11 6 - 20 mg/dL   Creatinine, Ser 0.53 0.44 - 1.00 mg/dL   Calcium 9.1 8.9 - 10.3 mg/dL   Total Protein 8.1 6.5 - 8.1 g/dL   Albumin 4.3 3.5 - 5.0 g/dL   AST 16 15 - 41 U/L   ALT 10 (L) 14 - 54 U/L   Alkaline Phosphatase 58 38 - 126 U/L   Total Bilirubin 1.1 0.3 - 1.2 mg/dL   GFR calc non Af Amer >60 >60 mL/min   GFR calc Af Amer >60 >60 mL/min    Comment: (NOTE) The eGFR has been calculated using the CKD EPI  equation. This calculation has not been validated in all clinical situations. eGFR's persistently <60 mL/min signify possible Chronic Kidney Disease.    Anion gap 5 5 - 15  Ethanol (ETOH)     Status: None   Collection Time: 12/17/14  3:35 PM  Result Value Ref Range   Alcohol, Ethyl (B) <5 <5 mg/dL    Comment:        LOWEST  DETECTABLE LIMIT FOR SERUM ALCOHOL IS 5 mg/dL FOR MEDICAL PURPOSES ONLY   Salicylate level     Status: None   Collection Time: 12/17/14  3:35 PM  Result Value Ref Range   Salicylate Lvl <8.2 2.8 - 30.0 mg/dL  Acetaminophen level     Status: Abnormal   Collection Time: 12/17/14  3:35 PM  Result Value Ref Range   Acetaminophen (Tylenol), Serum <10 (L) 10 - 30 ug/mL    Comment:        THERAPEUTIC CONCENTRATIONS VARY SIGNIFICANTLY. A RANGE OF 10-30 ug/mL MAY BE AN EFFECTIVE CONCENTRATION FOR MANY PATIENTS. HOWEVER, SOME ARE BEST TREATED AT CONCENTRATIONS OUTSIDE THIS RANGE. ACETAMINOPHEN CONCENTRATIONS >150 ug/mL AT 4 HOURS AFTER INGESTION AND >50 ug/mL AT 12 HOURS AFTER INGESTION ARE OFTEN ASSOCIATED WITH TOXIC REACTIONS.   CBC     Status: Abnormal   Collection Time: 12/17/14  3:35 PM  Result Value Ref Range   WBC 8.2 4.0 - 10.5 K/uL   RBC 3.58 (L) 3.87 - 5.11 MIL/uL   Hemoglobin 11.0 (L) 12.0 - 15.0 g/dL   HCT 31.4 (L) 36.0 - 46.0 %   MCV 87.7 78.0 - 100.0 fL   MCH 30.7 26.0 - 34.0 pg   MCHC 35.0 30.0 - 36.0 g/dL   RDW 15.5 11.5 - 15.5 %   Platelets 333 150 - 400 K/uL    Current Facility-Administered Medications  Medication Dose Route Frequency Provider Last Rate Last Dose  . azaTHIOprine (IMURAN) tablet 150 mg  150 mg Oral Daily Patrecia Pour, NP   150 mg at 12/18/14 1028  . FLUoxetine (PROZAC) capsule 20 mg  20 mg Oral Daily Patrecia Pour, NP   20 mg at 12/18/14 1027  . hydrOXYzine (ATARAX/VISTARIL) tablet 50 mg  50 mg Oral Q6H PRN Laverle Hobby, PA-C   50 mg at 12/18/14 0606  . ibuprofen (ADVIL,MOTRIN) tablet 600 mg  600 mg Oral Q6H PRN  Clarene Reamer, MD      . levothyroxine (SYNTHROID, LEVOTHROID) tablet 100 mcg  100 mcg Oral QAC breakfast Patrecia Pour, NP   100 mcg at 12/18/14 0748  . montelukast (SINGULAIR) tablet 10 mg  10 mg Oral QHS Patrecia Pour, NP   10 mg at 12/17/14 2120  . prazosin (MINIPRESS) capsule 1 mg  1 mg Oral QHS Patrecia Pour, NP   1 mg at 12/17/14 2120  . zolpidem (AMBIEN) tablet 10 mg  10 mg Oral QHS Patrecia Pour, NP   10 mg at 12/17/14 2120   Current Outpatient Prescriptions  Medication Sig Dispense Refill  . albuterol (PROVENTIL HFA;VENTOLIN HFA) 108 (90 BASE) MCG/ACT inhaler Inhale 2 puffs into the lungs every 6 (six) hours as needed for wheezing or shortness of breath.    . azaTHIOprine (IMURAN) 50 MG tablet Take 150 mg by mouth daily.    Marland Kitchen FLUoxetine (PROZAC) 10 MG capsule TK 1 C PO QD  0  . levothyroxine (SYNTHROID, LEVOTHROID) 100 MCG tablet Take 1 tablet (100 mcg total) by mouth daily before breakfast. 30 tablet 3  . methylphenidate 27 MG PO CR tablet TK 1 T PO QAM  0  . montelukast (SINGULAIR) 10 MG tablet TK 1 T PO QHS  3  . prazosin (MINIPRESS) 1 MG capsule TK 1 TO 2 CS PO QHS  1  . predniSONE (DELTASONE) 20 MG tablet Take 3 tablets (60 mg total) by mouth daily with breakfast. For 2 days (Patient taking differently: Take 10  mg by mouth daily as needed (inflammation). ) 6 tablet 0  . zolpidem (AMBIEN) 10 MG tablet TK 1 PO QHS PRN  0  . Alum & Mag Hydroxide-Simeth (MAGIC MOUTHWASH W/LIDOCAINE) SOLN Take 5 mLs by mouth 3 (three) times daily. (Patient not taking: Reported on 12/17/2014) 20 mL 3  . azathioprine (IMURAN) 100 MG tablet Take 1 tablet (100 mg total) by mouth daily. (Patient not taking: Reported on 08/08/2014) 30 tablet 3  . cephALEXin (KEFLEX) 500 MG capsule Take 1 capsule (500 mg total) by mouth 4 (four) times daily. (Patient not taking: Reported on 12/17/2014) 40 capsule 0  . HYDROcodone-acetaminophen (NORCO/VICODIN) 5-325 MG per tablet Take 1-2 tablets by mouth every 4 (four)  hours as needed. (Patient not taking: Reported on 12/17/2014) 20 tablet 0  . omeprazole (PRILOSEC) 20 MG capsule Take 1 capsule (20 mg total) by mouth daily. (Patient not taking: Reported on 12/17/2014) 30 capsule 0  . ondansetron (ZOFRAN ODT) 8 MG disintegrating tablet Take 1 tablet (8 mg total) by mouth every 8 (eight) hours as needed for nausea or vomiting. (Patient not taking: Reported on 12/17/2014) 10 tablet 0  . ondansetron (ZOFRAN) 4 MG tablet Take 1 tablet (4 mg total) by mouth every 8 (eight) hours as needed for nausea or vomiting. (Patient not taking: Reported on 08/22/2014) 20 tablet 0  . oxyCODONE-acetaminophen (PERCOCET/ROXICET) 5-325 MG per tablet Take 1-2 tablets by mouth every 6 (six) hours as needed for severe pain. (Patient not taking: Reported on 12/17/2014) 15 tablet 0  . predniSONE (DELTASONE) 20 MG tablet Take 2 tablets (40 mg total) by mouth once. (Patient not taking: Reported on 08/16/2014) 28 tablet 0  . [DISCONTINUED] DULoxetine (CYMBALTA) 30 MG capsule Take 1 capsule (30 mg total) by mouth daily. (Patient not taking: Reported on 07/20/2014) 30 capsule 3    Musculoskeletal: Strength & Muscle Tone: within normal limits Gait & Station: normal Patient leans: N/A  Psychiatric Specialty Exam: ROS  Blood pressure 90/53, pulse 61, temperature 98.6 F (37 C), temperature source Oral, resp. rate 16, last menstrual period 11/17/2014, SpO2 100 %.There is no weight on file to calculate BMI.  General Appearance: Fairly Groomed  Engineer, water::  Good  Speech:  Clear and Coherent  Volume:  Normal  Mood:  Depressed  Affect:  Depressed  Thought Process:  Coherent  Orientation:  Full (Time, Place, and Person)  Thought Content:  Negative  Suicidal Thoughts:  Yes.  with intent/plan  Homicidal Thoughts:  No  Memory:  Immediate;   Good Recent;   Good Remote;   Good  Judgement:  Good  Insight:  Good  Psychomotor Activity:  Normal  Concentration:  Good  Recall:  Good  Fund of  Knowledge:Good  Language: Good  Akathisia:  Negative  Handed:  Right  AIMS (if indicated):     Assets:  Communication Skills Desire for Improvement Financial Resources/Insurance Housing Social Support Talents/Skills Transportation Vocational/Educational  ADL's:  Intact  Cognition: WNL  Sleep:   poor   Treatment Plan Summary: Daily contact with patient to assess and evaluate symptoms and progress in treatment, Medication management and Plan transfer to [facility] inpatient psychiatry for treatment of depression and suicidal thoughts  Disposition: Recommend psychiatric Inpatient admission when medically cleared.  Donnelly Angelica 12/18/2014 1:01 PM

## 2014-12-18 NOTE — BH Assessment (Addendum)
Per [facility], patient has a bed assignment at [facility] ([id number]). Patient accepted to [facility] by Dr. Ladona Ridgel and Julieanne Cotton, NP. Patient attending provider at [facility] will be Dr. Jama Flavors. Nursing report [phone]. Support paperwork completed. Patient is voluntary and Juel Burrow will provider transportation.

## 2014-12-18 NOTE — Progress Notes (Signed)
Patient ID: Ashley Anthony, female   DOB: 07-22-1995, 19 y.o.   MRN: [id number]  Ashley Anthony is a 20 year old female admitted voluntarily to [facility] post suicide attempt. Patient held a loaded gun to her head however before she pulled the trigger she said she saw her dog and that was what kept her alive. Patient has a history of Severe major depression, single episode, without psychotic features, Behcet's syndrome, Depression, Insomnia, Asthma, Thyroid disease, Fibromyalgia, and ADHD. Patient reports her main stressor is the autoimmune disease that she has been dealing with chronically. Thus, she has to call out of work frequently and increases her anxiety and depression. Patient reports she is passive SI and is able to contract for safety. Patient denies SI/HI and A/V hallucinations. She reports pain and was given PRN Ibuprofen and Vistaril for anxiety. She was oriented to the unit and verbalized understanding of the admission process. Patient was given a snack as well and hygiene products. Q15 minute safety checks were initiated and are maintained.

## 2014-12-18 NOTE — Tx Team (Signed)
Initial Interdisciplinary Treatment Plan   PATIENT STRESSORS: Health problems   PATIENT STRENGTHS: Active sense of humor Average or above average intelligence Communication skills   PROBLEM LIST: Problem List/Patient Goals Date to be addressed Date deferred Reason deferred Estimated date of resolution  Suicidal Ideation 12/18/14           Depression 12/18/14           Anxiety  12/18/14           Physical Health issues  12/18/14           "Find out what's wrong with me." "Get the proper help I need." 12/18/14      DISCHARGE CRITERIA:  Improved stabilization in mood, thinking, and/or behavior Motivation to continue treatment in a less acute level of care Verbal commitment to aftercare and medication compliance  PRELIMINARY DISCHARGE PLAN: Outpatient therapy Return to previous living arrangement  PATIENT/FAMIILY INVOLVEMENT: This treatment plan has been presented to and reviewed with the patient, Ashley Anthony.  The patient and family have been given the opportunity to ask questions and make suggestions.  Marzetta Board E 12/18/2014, 6:05 PM

## 2014-12-18 NOTE — ED Notes (Signed)
Patient transferred to [facility].  Left the unit ambulatory with [employer].  Her mother brought in a backpack with clothing which was sent with her.  Mother aware that patient is going over and gave her the phone number to the adult unit.

## 2014-12-18 NOTE — Progress Notes (Signed)
Adult Psychoeducational Group Note  Date:  12/18/2014 Time:  10:00 PM  Group Topic/Focus:  Wrap-Up Group:   The focus of this group is to help patients review their daily goal of treatment and discuss progress on daily workbooks.  Participation Level:  Active  Participation Quality:  Appropriate  Affect:  Appropriate  Cognitive:  Appropriate  Insight: Appropriate  Engagement in Group:  Engaged  Modes of Intervention:  Discussion  Additional Comments:  The patient expressed that he attended group.   Octavio Manns 12/18/2014, 10:00 PM

## 2014-12-19 DIAGNOSIS — R45851 Suicidal ideations: Secondary | ICD-10-CM

## 2014-12-19 MED ORDER — POLYETHYLENE GLYCOL 3350 17 G PO PACK: 17.0000 g | PACK | Freq: Every day | ORAL | Status: DC | PRN

## 2014-12-19 MED ORDER — ARIPIPRAZOLE 2 MG PO TABS
2.0000 mg | ORAL_TABLET | Freq: Every day | ORAL | Status: DC
  Administered 2014-12-19 – 2014-12-22 (×4): 2 mg via ORAL
  Filled 2014-12-19 (×7): qty 1
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
## 2014-12-19 NOTE — H&P (Addendum)
Psychiatric Admission Assessment Adult  Patient Identification: Ashley Anthony MRN:  [id number] Date of Evaluation:  12/19/2014 Chief Complaint:   " I was thinking of shooting myself " Principal Diagnosis:  Major Depression, Severe, Recurrent, without  psychotic features  Diagnosis:   Patient Active Problem List   Diagnosis Date Noted  . Severe major depression, single episode, without psychotic features ([place]) [F32.2] 12/18/2014    Class: Acute  . 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 Thyroid activity decreased [E03.9] 06/27/2014  . Chronic pain syndrome [G89.4] 06/27/2014  . Depression [F32.9] 06/27/2014  . Attention deficit hyperactivity disorder [F90.9] 06/27/2014  . Insomnia [G47.00] 06/27/2014   History of Present Illness:: 19 year old female, who had recent suicidal thought of shooting self . States she actually held a gun in her hands , but decided to call 911.  She states she had been thinking of suicide for a few days prior, but not specifically thinking of shooting self . She states she has had chronic depression, which has been worsening. She attributes her depression at least partly to medical issues - states she has a chronic medal illness . She  has been diagnosed with Behcet Syndrome and with Hashimoto's Thyroiditis .  She states this disease is chronic and she was originally diagnosed back in 2010. She is followed by [facility] and states she takes Prednisone and on off depending on whether disease is exacerbated . Of note , she had not been taking steroids recently. Of note, she states she had been started on Prozac 2 weeks prior to admission, after 1 year or more of not being on any psychiatric medications- states she did not feel Prozac did much for her , but that her mother did feel it was helping .  She  Describes neuro-vegetative symptoms of depression as below.  Associated Signs/Symptoms: Depression Symptoms:  depressed  mood, anhedonia, insomnia, suicidal thoughts with specific plan, panic attacks, loss of energy/fatigue, decreased appetite, (Hypo) Manic Symptoms:  Denies any symptoms of mania  Anxiety Symptoms:  Denies excessive worrying, denies any social anxiety, denies agoraphobia, has had several panic attacks  Psychotic Symptoms:   Denies  PTSD Symptoms: Denies PTSD  Total Time spent with patient: 45 minutes  Past Psychiatric History:  No prior psychiatric admissions, no prior suicide attempts, history of self cutting but not since she was 19 years old. No history of psychosis, no history or mania . States she had not been on any psychiatric medications up to recently. She did see a psychiatrist 2 weeks ago and was started on Prozac , which she feels did not help, but states " my mother felt it was helping because she felt I was in a better mood ".   Risk to Self: Is patient at risk for suicide?: Yes What has been your use of drugs/alcohol within the last 12 months?: Smoke THC: 1x a month Risk to Others:   Prior Inpatient Therapy:   Prior Outpatient Therapy:    Alcohol Screening: 1. How often do you have a drink containing alcohol?: Never 9. Have you or someone else been injured as a result of your drinking?: No 10. Has a relative or friend or a doctor or another health worker been concerned about your drinking or suggested you cut down?: No Alcohol Use Disorder Identification Test Final Score (AUDIT): 0 Brief Intervention: AUDIT score less than 7 or less-screening does not suggest unhealthy drinking-brief intervention not indicated Substance Abuse History in the last 12 months:  Denies  alcohol or drug abuse  Consequences of Substance Abuse: Denies  Previous Psychotropic Medications:  Wellbutrin, Trazodone, Cymbalta, Klonopin in the past. Started on Prozac and Klonopin recently - 2 weeks ago.  Psychological Evaluations:  No  Past Medical History:  Past Medical History  Diagnosis Date  .  Fibromyalgia   . Autoimmune disorder ([place])   . Thyroid disease   . Asthma   . Migraine   . 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 No pertinent past surgical history. Family History: parents alive, divorced, patient lives with mother and stepfather. Father lives in [place] . Has one brother .  Family History  Problem Relation Age of Onset  . Heart disease Father   . Cancer Maternal Aunt   . Diabetes Maternal Grandmother   . Diabetes Paternal Grandfather    Family Psychiatric  History:  There is a history of depression in the family, mother attempted suicide in the past, maternal grandmother alcoholic .  Social History: Single, no children, lives with mother and step father, was enrolled in college ( psychology ) , but had to withdraw last semester because of her medical illness . She recently moved to [facility] from [place] in February 2016. No SO at this time. No legal issues. She had been working at a [employer] , but has taken leave of absence. Marland Kitchen  History  Alcohol Use No     History  Drug Use No    Social History   Social History  . Marital Status: Single    Spouse Name: N/A  . Number of Children: N/A  . Years of Education: N/A   Social History Main Topics  . Smoking status: Never Smoker   . Smokeless tobacco: Never Used  . Alcohol Use: No  . Drug Use: No  . Sexual Activity: Not Currently   Other Topics Concern  . None   Social History Narrative   Additional Social History:  Allergies:   Allergies  Allergen Reactions  . Remicade [Infliximab] Anaphylaxis   Lab Results:  Results for orders placed or performed during the hospital encounter of 12/17/14 (from the past 48 hour(s))  Urine rapid drug screen (hosp performed) (Not at [facility])     Status: Abnormal   Collection Time: 12/17/14  3:29 PM  Result Value Ref Range   Opiates NONE DETECTED NONE DETECTED   Cocaine NONE DETECTED NONE DETECTED   Benzodiazepines POSITIVE (A) NONE DETECTED   Amphetamines NONE  DETECTED NONE DETECTED   Tetrahydrocannabinol NONE DETECTED NONE DETECTED   Barbiturates NONE DETECTED NONE DETECTED    Comment:        DRUG SCREEN FOR MEDICAL PURPOSES ONLY.  IF CONFIRMATION IS NEEDED FOR ANY PURPOSE, NOTIFY LAB WITHIN 5 DAYS.        LOWEST DETECTABLE LIMITS FOR URINE DRUG SCREEN Drug Class       Cutoff (ng/mL) Amphetamine      1000 Barbiturate      200 Benzodiazepine   749 Tricyclics       449 Opiates          300 Cocaine          300 THC              50   Pregnancy, urine     Status: None   Collection Time: 12/17/14  3:29 PM  Result Value Ref Range   Preg Test, Ur NEGATIVE NEGATIVE    Comment:        THE SENSITIVITY  OF THIS METHODOLOGY IS >20 mIU/mL.   Comprehensive metabolic panel     Status: Abnormal   Collection Time: 12/17/14  3:35 PM  Result Value Ref Range   Sodium 135 135 - 145 mmol/L   Potassium 3.6 3.5 - 5.1 mmol/L   Chloride 108 101 - 111 mmol/L   CO2 22 22 - 32 mmol/L   Glucose, Bld 89 65 - 99 mg/dL   BUN 11 6 - 20 mg/dL   Creatinine, Ser 0.53 0.44 - 1.00 mg/dL   Calcium 9.1 8.9 - 10.3 mg/dL   Total Protein 8.1 6.5 - 8.1 g/dL   Albumin 4.3 3.5 - 5.0 g/dL   AST 16 15 - 41 U/L   ALT 10 (L) 14 - 54 U/L   Alkaline Phosphatase 58 38 - 126 U/L   Total Bilirubin 1.1 0.3 - 1.2 mg/dL   GFR calc non Af Amer >60 >60 mL/min   GFR calc Af Amer >60 >60 mL/min    Comment: (NOTE) The eGFR has been calculated using the CKD EPI equation. This calculation has not been validated in all clinical situations. eGFR's persistently <60 mL/min signify possible Chronic Kidney Disease.    Anion gap 5 5 - 15  Ethanol (ETOH)     Status: None   Collection Time: 12/17/14  3:35 PM  Result Value Ref Range   Alcohol, Ethyl (B) <5 <5 mg/dL    Comment:        LOWEST DETECTABLE LIMIT FOR SERUM ALCOHOL IS 5 mg/dL FOR MEDICAL PURPOSES ONLY   Salicylate level     Status: None   Collection Time: 12/17/14  3:35 PM  Result Value Ref Range   Salicylate Lvl <5.0 2.8  - 30.0 mg/dL  Acetaminophen level     Status: Abnormal   Collection Time: 12/17/14  3:35 PM  Result Value Ref Range   Acetaminophen (Tylenol), Serum <10 (L) 10 - 30 ug/mL    Comment:        THERAPEUTIC CONCENTRATIONS VARY SIGNIFICANTLY. A RANGE OF 10-30 ug/mL MAY BE AN EFFECTIVE CONCENTRATION FOR MANY PATIENTS. HOWEVER, SOME ARE BEST TREATED AT CONCENTRATIONS OUTSIDE THIS RANGE. ACETAMINOPHEN CONCENTRATIONS >150 ug/mL AT 4 HOURS AFTER INGESTION AND >50 ug/mL AT 12 HOURS AFTER INGESTION ARE OFTEN ASSOCIATED WITH TOXIC REACTIONS.   CBC     Status: Abnormal   Collection Time: 12/17/14  3:35 PM  Result Value Ref Range   WBC 8.2 4.0 - 10.5 K/uL   RBC 3.58 (L) 3.87 - 5.11 MIL/uL   Hemoglobin 11.0 (L) 12.0 - 15.0 g/dL   HCT 31.4 (L) 36.0 - 46.0 %   MCV 87.7 78.0 - 100.0 fL   MCH 30.7 26.0 - 34.0 pg   MCHC 35.0 30.0 - 36.0 g/dL   RDW 15.5 11.5 - 15.5 %   Platelets 333 150 - 277 K/uL    Metabolic Disorder Labs:  No results found for: HGBA1C, MPG No results found for: PROLACTIN No results found for: CHOL, TRIG, HDL, CHOLHDL, VLDL, LDLCALC  Current Medications: Current Facility-Administered Medications  Medication Dose Route Frequency Provider Last Rate Last Dose  . acetaminophen (TYLENOL) tablet 650 mg  650 mg Oral Q6H PRN Clarene Reamer, MD      . alum & mag hydroxide-simeth (MAALOX/MYLANTA) 200-200-20 MG/5ML suspension 30 mL  30 mL Oral Q4H PRN Clarene Reamer, MD      . azaTHIOprine Ilean Skill) tablet 150 mg  150 mg Oral Daily Clarene Reamer, MD   150 mg at  12/19/14 0837  . FLUoxetine (PROZAC) capsule 20 mg  20 mg Oral Daily Clarene Reamer, MD   20 mg at 12/19/14 0837  . hydrOXYzine (ATARAX/VISTARIL) tablet 50 mg  50 mg Oral Q6H PRN Clarene Reamer, MD   50 mg at 12/19/14 0841  . ibuprofen (ADVIL,MOTRIN) tablet 600 mg  600 mg Oral Q6H PRN Clarene Reamer, MD   600 mg at 12/19/14 0837  . Influenza vac split quadrivalent PF (FLUARIX) injection 0.5 mL  0.5 mL Intramuscular  Tomorrow-1000 Myer Peer Raman Featherston, MD      . levothyroxine (SYNTHROID, LEVOTHROID) tablet 100 mcg  100 mcg Oral QAC breakfast Clarene Reamer, MD   100 mcg at 12/19/14 0612  . magnesium hydroxide (MILK OF MAGNESIA) suspension 30 mL  30 mL Oral Daily PRN Clarene Reamer, MD      . montelukast (SINGULAIR) tablet 10 mg  10 mg Oral QHS Clarene Reamer, MD   10 mg at 12/18/14 2114  . polyethylene glycol (MIRALAX / GLYCOLAX) packet 17 g  17 g Oral Daily PRN Laverle Hobby, PA-C      . prazosin (MINIPRESS) capsule 1 mg  1 mg Oral QHS Clarene Reamer, MD   1 mg at 12/18/14 2114  . zolpidem (AMBIEN) tablet 5 mg  5 mg Oral QHS Clarene Reamer, MD   5 mg at 12/18/14 2114   PTA Medications: Prescriptions prior to admission  Medication Sig Dispense Refill Last Dose  . albuterol (PROVENTIL HFA;VENTOLIN HFA) 108 (90 BASE) MCG/ACT inhaler Inhale 2 puffs into the lungs every 6 (six) hours as needed for wheezing or shortness of breath.   Past Week at Unknown time  . azaTHIOprine (IMURAN) 50 MG tablet Take 150 mg by mouth daily.   12/16/2014 at Unknown time  . FLUoxetine (PROZAC) 10 MG capsule 10 mg daily  0 12/16/2014 at Unknown time  . levothyroxine (SYNTHROID, LEVOTHROID) 100 MCG tablet Take 1 tablet (100 mcg total) by mouth daily before breakfast. 30 tablet 3 12/16/2014 at Unknown time  . methylphenidate 27 MG PO CR tablet 27 mg daily  0 Past Week at unknown time  . montelukast (SINGULAIR) 10 MG tablet 10 mg at bedtime  3 12/16/2014 at Unknown time  . prazosin (MINIPRESS) 1 MG capsule 1-2 mg at bedtime  1 12/16/2014 at Unknown time  . predniSONE (DELTASONE) 20 MG tablet Take 3 tablets (60 mg total) by mouth daily with breakfast. For 2 days (Patient taking differently: Take 10 mg by mouth daily as needed (inflammation). ) 6 tablet 0 12/16/2014 at Unknown time  . zolpidem (AMBIEN) 10 MG tablet 10 mg at bedtime as needed for sleep  0 12/16/2014 at Unknown time  . Alum & Mag Hydroxide-Simeth (MAGIC MOUTHWASH W/LIDOCAINE)  SOLN Take 5 mLs by mouth 3 (three) times daily. (Patient not taking: Reported on 12/17/2014) 20 mL 3 Completed Course at Unknown time  . cephALEXin (KEFLEX) 500 MG capsule Take 1 capsule (500 mg total) by mouth 4 (four) times daily. (Patient not taking: Reported on 12/17/2014) 40 capsule 0 Completed Course at Unknown time  . HYDROcodone-acetaminophen (NORCO/VICODIN) 5-325 MG per tablet Take 1-2 tablets by mouth every 4 (four) hours as needed. (Patient not taking: Reported on 12/17/2014) 20 tablet 0 Completed Course at Unknown time  . omeprazole (PRILOSEC) 20 MG capsule Take 1 capsule (20 mg total) by mouth daily. (Patient not taking: Reported on 12/17/2014) 30 capsule 0 Completed Course at Unknown time  . ondansetron [facility]  ODT) 8 MG disintegrating tablet Take 1 tablet (8 mg total) by mouth every 8 (eight) hours as needed for nausea or vomiting. (Patient not taking: Reported on 12/17/2014) 10 tablet 0 Completed Course at Unknown time  . ondansetron (ZOFRAN) 4 MG tablet Take 1 tablet (4 mg total) by mouth every 8 (eight) hours as needed for nausea or vomiting. (Patient not taking: Reported on 08/22/2014) 20 tablet 0 Completed Course at Unknown time  . oxyCODONE-acetaminophen (PERCOCET/ROXICET) 5-325 MG per tablet Take 1-2 tablets by mouth every 6 (six) hours as needed for severe pain. (Patient not taking: Reported on 12/17/2014) 15 tablet 0 Completed Course at Unknown time    Musculoskeletal: Strength & Muscle Tone: within normal limits Gait & Station: normal Patient leans: N/A  Psychiatric Specialty Exam: Physical Exam  Review of Systems  Constitutional: Positive for weight loss and malaise/fatigue. Negative for fever and chills.  Eyes: Negative.   Respiratory: Positive for shortness of breath.        Normally occurs when anxious   Cardiovascular: Negative.   Gastrointestinal: Positive for nausea and constipation. Negative for vomiting, abdominal pain, diarrhea, blood in stool and melena.   Genitourinary: Negative.   Musculoskeletal: Positive for joint pain.  Skin: Negative.   Neurological: Negative for seizures.  Endo/Heme/Allergies: Negative.   Psychiatric/Behavioral: Positive for depression and suicidal ideas.  All other systems reviewed and are negative.   Blood pressure 91/47, pulse 105, temperature 98.1 F (36.7 C), temperature source Oral, resp. rate 18, height $RemoveBe'5\' 2"'wboBWBLEX$  (1.575 m), weight 144 lb (65.318 kg), last menstrual period 11/17/2014, SpO2 100 %.Body mass index is 26.33 kg/(m^2).  General Appearance: Fairly Groomed  Engineer, water::  Good  Speech:  Normal Rate  Volume:  Decreased  Mood:  Depressed  Affect:  Constricted  Thought Process:  Linear  Orientation:  Full (Time, Place, and Person)  Thought Content:  denies hallucinations, no delusions , not internally preoccupied   Suicidal Thoughts:  Yes.  without intent/plan - describes some passive thoughts of death, dying, but at this time denies any plan or intention of hurting self and is able to contract for safety on the unit .   Homicidal Thoughts:  No  Memory:  recent and remote grossly intact   Judgement:  Fair  Insight:  Present  Psychomotor Activity:  Decreased  Concentration:  Good  Recall:  Good  Fund of Knowledge:Good  Language: Good  Akathisia:  Negative  Handed:  Right  AIMS (if indicated):     Assets:  Communication Skills Desire for Improvement Housing Resilience Social Support  ADL's:  Intact  Cognition: WNL  Sleep:  Number of Hours: 2.75     Treatment Plan Summary: Daily contact with patient to assess and evaluate symptoms and progress in treatment, Medication management, Plan inpatient admission and medications / treatment as below  Observation Level/Precautions:  15 minute checks  Laboratory:  As needed   Psychotherapy:  Milieu, support   Medications:  We discussed  Options- as patient reports that mother had noticed some improvement of mood on Prozac , which she started 2 weeks  ago,  She is wanting to continue Prozac trial for now. Agrees to Abilify as augmentation strategy, start at 2 mgrs QDAY  She takes Ambien for insomnia, has had no side effects. Wants to continue this medication.   Consultations:  As needed   Discharge Concerns:   -   Estimated LOS: 6 days   Other:     I certify that inpatient services  furnished can reasonably be expected to improve the patient's condition.   Jaquari Reckner 10/5/201611:43 AM

## 2014-12-19 NOTE — Progress Notes (Signed)
Pt rated her day 4 out of 10. Her goal for today was to start writing in a journal and talking with her Child psychotherapist. Her goal for tomorrow is to work on discharge planning.

## 2014-12-19 NOTE — BHH Group Notes (Signed)
[facility] LCSW Aftercare Discharge Planning Group Note  12/19/2014 8:45 AM  Participation Quality: Alert, Appropriate and Oriented  Mood/Affect: Flat  Depression Rating: 3  Anxiety Rating: 4  Thoughts of Suicide: Pt denies SI/HI  Will you contract for safety? Yes  Current AVH: Pt denies  Plan for Discharge/Comments: Pt attended discharge planning group and actively participated in group. CSW discussed suicide prevention education with the group and encouraged them to discuss discharge planning and any relevant barriers. Pt reports no immediate needs, but reports that she has questions for CSW.  Transportation Means: Pt reports access to transportation  Supports: No supports mentioned at this time  Chad Cordial, LCSWA 12/19/2014 9:40 AM

## 2014-12-19 NOTE — Progress Notes (Signed)
Recreation Therapy Notes   Date: 10.05.2016 Time: 9:30am Location: [address] Group Room   Group Topic: Stress Management  Goal Area(s) Addresses:  Patient will actively participate in stress management techniques presented during session.   Behavioral Response: Did not attend.   Maleigha Colvard L Yuniel Blaney, LRT/CTRS        Lovely Kerins L 12/19/2014 1:12 PM 
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
## 2014-12-19 NOTE — Progress Notes (Signed)
Nutrition Brief Note  Patient identified on the Malnutrition Screening Tool (MST) Report  Pt admitted with depression and SI. PMHx of Behcet's syndrome. Pt has had 6 lb weight loss over 4 months, insignificant for time frame. UBW is 144 lb. No nutritional needs identified.  Wt Readings from Last 15 Encounters:  12/18/14 144 lb (65.318 kg)  08/16/14 150 lb (68.04 kg)  06/27/14 144 lb 3.2 oz (65.409 kg)  06/14/14 144 lb (65.318 kg)  06/13/14 144 lb 9 oz (65.573 kg)    Body mass index is 26.33 kg/(m^2). Patient meets criteria for overweight based on current BMI.    Diet Order: Diet regular Room service appropriate?: Yes; Fluid consistency:: Thin Pt is also offered choice of unit snacks mid-morning and mid-afternoon.  Pt is eating as desired.    Labs and medications reviewed.   No nutrition interventions warranted at this time. If nutrition issues arise, please consult RD.   Tilda Franco, MS, RD, LDN Pager: [phone] After Hours Pager: [phone]

## 2014-12-19 NOTE — Progress Notes (Signed)
D: Patient alert and oriented x 4. Patient denies SI/HI/AVH. Patient states pain is a 5/10 during assessment. Patient refused medication but accepted heat packs for her knees. At 2320 patient stated pain was 8/10 and PRN Ibuprofen was given with no effective results on assessment. Patient requested heat packs. Patient states anxiety was increased because of pain 2320 PRN Vistaril given with effective results. Patient is currently resting in bed with eyes closed. Will continue to monitor.  A: Staff to monitor Q 15 mins for safety. Encouragement and support offered. Scheduled medications administered per orders. R: Patient remains safe on the unit. Patient attended group tonight. Patient visible on hte unit and interacting with peers. Patient taking administered medications.

## 2014-12-19 NOTE — Progress Notes (Signed)
DAR Note Ashley Anthony has been visible on the unit.  Attending groups but reports that she is very anxious about attending groups.  "I don't like to talk much."  Interacting with peers.  She denies SI/HI or A/V hallucinations.  Self inventory completed and reports depression 7/10, hopelessness 3/10 and anxiety 7/10.  Goal for today is to see the doctor and get on the right medications .  She reports generalized pain 8/10 and ibuprofen prn was ineffective.  Q 15 minute checks maintained for safety.  She appears to be in no physical distress.  Encouraged continued participation in group and unit activities.

## 2014-12-19 NOTE — Progress Notes (Signed)
D: Pt was in dayroom upon initial approach.  Pt has anxious affect and mood.  She reports she had an "okay" visit with her mother and stepfather tonight.  Pt reports her goal was "just to speak to the social worker and I did."  Pt reports her goal tomorrow is to "talk to the doctor about changing meds and trying something else."  Pt denies SI/HI, denies hallucinations, reports generalized pain of 7/10.  Pt has been visible in milieu interacting with peers and staff appropriately.  Pt attended evening group.   A: Introduced self to pt.  Met with pt 1:1 and provided support and encouragement.  Actively listened to pt.  Medications administered per order.  PRN medication administered for anxiety and pain. R: Pt is compliant with medications.  Pt verbally contracts for safety.  Will continue to monitor and assess.

## 2014-12-19 NOTE — Plan of Care (Signed)
Problem: Diagnosis: Increased Risk For Suicide Attempt Goal: STG-Patient Will Attend All Groups On The Unit Outcome: Progressing Pt attended evening group on 12/19/14.

## 2014-12-19 NOTE — [facility] Group Notes (Signed)
[facility] LCSW Group Therapy 12/19/2014 1:15 PM  Type of Therapy: Group Therapy- Emotion Regulation  Participation Level: Minimal  Participation Quality:  Appropriate  Affect: Appropriate  Cognitive: Alert and Oriented   Insight:  Developing/Improving  Engagement in Therapy: Developing/Improving and Engaged   Modes of Intervention: Clarification, Confrontation, Discussion, Education, Exploration, Limit-setting, Orientation, Problem-solving, Rapport Building, Dance movement psychotherapist, Socialization and Support  Summary of Progress/Problems: The topic for group today was emotional regulation. This group focused on both positive and negative emotion identification and allowed group members to process ways to identify feelings, regulate negative emotions, and find healthy ways to manage internal/external emotions. Group members were asked to reflect on a time when their reaction to an emotion led to a negative outcome and explored how alternative responses using emotion regulation would have benefited them. Group members were also asked to discuss a time when emotion regulation was utilized when a negative emotion was experienced. Pt participated minimally in group discussion but was observed to be attentive and engaging in active listening AEB maintained eye contact and nonverbal affirming gestures. Pt interacted with another peer and described that she has difficulty managing her anxiety as well.   Chad Cordial, LCSWA 12/19/2014 4:19 PM

## 2014-12-20 MED ORDER — ONDANSETRON HCL 4 MG PO TABS
4.0000 mg | ORAL_TABLET | Freq: Two times a day (BID) | ORAL | Status: DC | PRN
  Administered 2014-12-20: 4 mg via ORAL
  Filled 2014-12-20: qty 1

## 2014-12-20 MED ORDER — GABAPENTIN 100 MG PO CAPS
100.0000 mg | ORAL_CAPSULE | Freq: Three times a day (TID) | ORAL | Status: DC
  Administered 2014-12-20 – 2014-12-22 (×5): 100 mg via ORAL
  Filled 2014-12-20 (×12): qty 1

## 2014-12-20 NOTE — Progress Notes (Signed)
[facility] MD Progress Note  12/20/2014 11:29 AM Darrell Basquez  MRN:  [id number] Subjective: Patient states she is still feeling depressed, although she is feeling somewhat better compared to admission. At present denies medication side effects. She is concerned about chronic pain, which tends to worsen during periods of autoimmune disease exacerbation. We discussed treatment options- she has been on Cymbalta in the past - did not feel it worked, Lyrica  ( had side effects) and Neurontin. She does not remember having had any side effects on Neurontin, and is unsure how much it helped her , but states she is willing to try this medication again. Objective :Patient case discussed with treatment team and patient seen. Remains depressed, but presents partially improved compared to admission. She is tolerating medications well thus far . As noted, she reports chronic, diffuse pain. At present presents calm, and does not appear to be in any acute distress or discomfort. Patient states that while she was living in [place] was prescribed cannabis and that this helped . She is aware this is not an option at this time.  We have reviewed rationale for working on pain management with non narcotic/ non addictive medication/treatment options at first , and she agrees .  No disruptive behaviors on unit, going to some groups.  Appetite "OK", slept fairly.  Principal Problem:  Major Depression, Recurrent , Severe, No psychotic features - consider also Depression related to Chronic Medical Condition Diagnosis:   Patient Active Problem List   Diagnosis Date Noted  . 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 Thyroid activity decreased [E03.9] 06/27/2014  . Chronic pain syndrome [G89.4] 06/27/2014  . Depression [F32.9] 06/27/2014  . Attention deficit hyperactivity disorder [F90.9] 06/27/2014  . Insomnia  [G47.00] 06/27/2014   Total Time spent with patient: 25 minutes     Past Medical History:  Past Medical History  Diagnosis Date  . Fibromyalgia   . Autoimmune disorder (HCC)   . Thyroid disease   . Asthma   . Migraine   . Behcet's syndrome (HCC)    History reviewed. No pertinent past surgical history. Family History:  Family History  Problem Relation Age of Onset  . Heart disease Father   . Cancer Maternal Aunt   . Diabetes Maternal Grandmother   . Diabetes Paternal Grandfather     Social History:  History  Alcohol Use No     History  Drug Use No    Social History   Social History  . Marital Status: Single    Spouse Name: N/A  . Number of Children: N/A  . Years of Education: N/A   Social History Main Topics  . Smoking status: Never Smoker   . Smokeless tobacco: Never Used  . Alcohol Use: No  . Drug Use: No  . Sexual Activity: Not Currently   Other Topics Concern  . None   Social History Narrative   Additional Social History:   Sleep: Fair  Appetite:  Good  Current Medications: Current Facility-Administered Medications  Medication Dose Route Frequency Provider Last Rate Last Dose  . acetaminophen (TYLENOL) tablet 650 mg  650 mg Oral Q6H PRN Benjaman Pott, MD   650 mg at 12/19/14 2140  . alum & mag hydroxide-simeth (MAALOX/MYLANTA) 200-200-20 MG/5ML suspension 30 mL  30 mL Oral Q4H PRN Benjaman Pott, MD      . ARIPiprazole (ABILIFY) tablet 2 mg  2 mg Oral Daily Craige Cotta, MD   2 mg at 12/20/14 0819  . azaTHIOprine (IMURAN) tablet 150 mg  150 mg Oral Daily Benjaman Pott, MD   150 mg at 12/20/14 0819  . FLUoxetine (PROZAC) capsule 20 mg  20 mg Oral Daily Benjaman Pott, MD   20 mg at 12/20/14 0819  . gabapentin (NEURONTIN) capsule 100 mg  100 mg Oral TID Craige Cotta, MD      . hydrOXYzine (ATARAX/VISTARIL) tablet 50 mg  50 mg Oral Q6H PRN Benjaman Pott, MD   50 mg at 12/19/14 2102  . ibuprofen (ADVIL,MOTRIN) tablet 600 mg  600 mg  Oral Q6H PRN Benjaman Pott, MD   600 mg at 12/19/14 2039  . levothyroxine (SYNTHROID, LEVOTHROID) tablet 100 mcg  100 mcg Oral QAC breakfast Benjaman Pott, MD   100 mcg at 12/20/14 0615  . magnesium hydroxide (MILK OF MAGNESIA) suspension 30 mL  30 mL Oral Daily PRN Benjaman Pott, MD      . montelukast (SINGULAIR) tablet 10 mg  10 mg Oral QHS Benjaman Pott, MD   10 mg at 12/19/14 2102  . polyethylene glycol (MIRALAX / GLYCOLAX) packet 17 g  17 g Oral Daily PRN Kerry Hough, PA-C      . prazosin (MINIPRESS) capsule 1 mg  1 mg Oral QHS Benjaman Pott, MD   1 mg at 12/19/14 2102  . zolpidem (AMBIEN) tablet 5 mg  5 mg Oral QHS Benjaman Pott, MD   5 mg at 12/19/14 2249    Lab Results: No results found for this or any previous visit (from the past 48 hour(s)).  Physical Findings: AIMS: Facial and Oral Movements Muscles of Facial Expression: None, normal Lips and Perioral Area: None, normal Jaw: None, normal Tongue: None, normal,Extremity Movements Upper (arms, wrists, hands, fingers): None, normal Lower (legs, knees, ankles, toes): None, normal, Trunk Movements Neck, shoulders, hips: None, normal, Overall Severity Severity of abnormal movements (highest score from questions above): None, normal Incapacitation due to abnormal movements: None, normal Patient's awareness of abnormal movements (rate only patient's report): No Awareness, Dental Status Current problems with teeth and/or dentures?: No Does patient usually wear dentures?: No  CIWA:    COWS:     Musculoskeletal: Strength & Muscle Tone: within normal limits Gait & Station: normal Patient leans: N/A  Psychiatric Specialty Exam: ROS chronic diffuse pain, denies shortness of breath, no vomiting, no rash.   Blood pressure 102/49, pulse 98, temperature 98.5 F (36.9 C), temperature source Oral, resp. rate 16, height  (1.575 m), weight 144 lb (65.318 kg), last menstrual period 11/17/2014, SpO2 100 %.Body mass index  is 26.33 kg/(m^2).  General Appearance: improved grooming   Eye Contact::  Good  Speech:  Normal Rate  Volume:  Normal  Mood:  Depressed  Affect:  constricted but more reactive , smiles at times, appropriately  Thought Process:  Linear  Orientation:  Full (Time, Place, and Person)  Thought Content:  denies hallucinations, no delusions, not internally preoccupied   Suicidal Thoughts:  No at this time denies any suicidal ideations, denies self injurious ideations, contracts for safety on the unit   Homicidal Thoughts:  No  Memory:  recent and remote grossly intact   Judgement:  Fair  Insight:  Present  Psychomotor Activity:  Normal  Concentration:  Good  Recall:  Good  Fund of Knowledge:Good  Language: Good  Akathisia:  Negative  Handed:  Left  AIMS (if indicated):     Assets:  Communication Skills Desire for Improvement Resilience Vocational/Educational  ADL's:  Intact  Cognition: WNL  Sleep:  Number of Hours: 2.75  Assessment - patient remains depressed, constricted in affect, but improved compared to admission and not currently presenting with any psychotic symptoms or SI. Chronic medical illness, chronic pain are factors that contribute to depression. We discussed treatment options and agrees to Neurontin, which she states did not cause side effects during a prior trial. Of note, states Cymbalta trial in the past was poorly tolerated . Although depressed, is future oriented , and is actively discussing disposition treatment options Treatment Plan Summary: Daily contact with patient to assess and evaluate symptoms and progress in treatment, Medication management, Plan inpatient treatment and medication management as below   1. Start Neurontin 100 mgrs TID for pain and for anxiety 2. Continue Prozac 20 mgrs QDAY for depression 3. Continue  Ibuprofen / Acetaminophen PRNs for pain as needed  4. Agrees to Abliify 2 mgrs QDAY as augmentation strategy for depression management -  side effects discussed  5. Continue Ambien 5 mgrs QHS for insomnia . 6. Continue Minipress 1 mgrs QHS for management of nightmares  7. Continue Imuran and Synthroid for management of Behcet's Syndrome and Hypothyroidism , respectively. 8. Continue to provide milieu, groups to work on symptom reduction and coping skills . 9. Treatment team working on disposition planning, patient interested in [facility] after discharge .  COBOS, FERNANDO 12/20/2014, 11:29 AM

## 2014-12-20 NOTE — Tx Team (Signed)
Interdisciplinary Treatment Plan Update (Adult) Date: 12/20/2014   Date: 12/20/2014 2:02 PM  Progress in Treatment:  Attending groups: Yes  Participating in groups: Yes  Taking medication as prescribed: Yes  Tolerating medication: Yes  Family/Significant othe contact made: Yes, with mother Patient understands diagnosis: Yes Discussing patient identified problems/goals with staff: Yes  Medical problems stabilized or resolved: Yes  Denies suicidal/homicidal ideation: Yes Patient has not harmed self or Others: Yes   New problem(s) identified: None identified at this time.   Discharge Plan or Barriers:   Additional comments: n/a   Reason for Continuation of Hospitalization:  Anxiety Depression Medication stabilization Suicidal ideation  Estimated length of stay: 3-5 days  Review of initial/current patient goals per problem list:   1.  Goal(s): Patient will participate in aftercare plan  Met:  Yes  Target date: 3-5 days from date of admission   As evidenced by: Patient will participate within aftercare plan AEB aftercare provider and housing plan at discharge being identified.   12/20/14: Pt will return home and follow-up with [facility]  2.  Goal (s): Patient will exhibit decreased depressive symptoms and suicidal ideations.  Met:  Yes  Target date: 3-5 days from date of admission   As evidenced by: Patient will utilize self rating of depression at 3 or below and demonstrate decreased signs of depression or be deemed stable for discharge by MD.  12/20/14: Pt rates depression at 3/10; denies SI  3.  Goal(s): Patient will demonstrate decreased signs and symptoms of anxiety.  Met:  Yes  Target date: 3-5 days from date of admission   As evidenced by: Patient will utilize self rating of anxiety at 3 or below and demonstrated decreased signs of anxiety, or be deemed stable for discharge by MD  12/20/14: Pt rates anxiety at 4/10. Express that her anxiety is  manageable.  Attendees:  Patient:    Family:    Physician: Dr. Parke Poisson, MD  12/20/2014 2:02 PM  Nursing: Lars Pinks, RN Case manager  12/20/2014 2:02 PM  Clinical Social Worker Norman Clay, MSW 12/20/2014 2:02 PM  Other: Lucinda Dell, Beverly Sessions Liasion 12/20/2014 2:02 PM  Clinical: Marcella Dubs, RN 12/20/2014 2:02 PM  Other: , RN Charge Nurse 12/20/2014 2:02 PM  Other:     Peri Maris, Latanya Presser MSW

## 2014-12-20 NOTE — Progress Notes (Signed)
D: Pt currently flat in affect and appropriate in mood. Pt forwards at little in interaction with Clinical research associate. Pt complains of generalized pain. Pt is currently negative for any suicidal ideation. Pt attended Karaoke. Pt observed interacting with others appropriately.   A: Writer administered scheduled and prn medications to pt, per MD orders. Continued support and availability as needed was extended to this pt. Staff continue to monitor pt with q50min checks.  R: No adverse drug reactions noted. Pt receptive to treatment. Pt remains safe at this time.

## 2014-12-20 NOTE — [facility] Group Notes (Signed)
[facility] Group Therapy 12/20/2014 1:15pm  Type of Therapy: Mental Health Association Presentation  Participation Level: Active  Participation Quality: Attentive  Affect: Appropriate  Cognitive: Oriented  Insight: Developing/Improving  Engagement in Therapy: Engaged  Modes of Intervention: Discussion, Education and Socialization  Summary of Progress/Problems: Mental Health Association (MHA) Speaker came to talk about his personal journey with substance abuse and addiction. The pt processed ways by which to relate to the speaker. MHA speaker provided handouts and educational information pertaining to groups and services offered by the [facility]. Pt was engaged in speaker's presentation and was receptive to resources provided.    Chad Cordial, LCSWA 12/20/2014 2:02 PM

## 2014-12-20 NOTE — [facility] Suicide Risk Assessment (Signed)
[facility] INPATIENT:  Family/Significant Other Suicide Prevention Education  Suicide Prevention Education:  Education Completed; Ashley Anthony, Pt's mother [phone],  has been identified by the patient as the family member/significant other with whom the patient will be residing, and identified as the person(s) who will aid the patient in the event of a mental health crisis (suicidal ideations/suicide attempt).  With written consent from the patient, the family member/significant other has been provided the following suicide prevention education, prior to the and/or following the discharge of the patient.  The suicide prevention education provided includes the following:  Suicide risk factors  Suicide prevention and interventions  National Suicide Hotline telephone number  [facility] assessment telephone number  [facility] Emergency Assistance 911  [facility] and/or Residential Mobile Crisis Unit telephone number  Request made of family/significant other to:  Remove weapons (e.g., guns, rifles, knives), all items previously/currently identified as safety concern.    Remove drugs/medications (over-the-counter, prescriptions, illicit drugs), all items previously/currently identified as a safety concern.  The family member/significant other verbalizes understanding of the suicide prevention education information provided.  The family member/significant other agrees to remove the items of safety concern listed above.  Chad Cordial M 12/20/2014, 9:01 AM

## 2014-12-20 NOTE — Progress Notes (Signed)
Patient ID: Ashley Anthony, female   DOB: 08-28-1995, 19 y.o.   MRN: [id number] Adult Psychoeducational Group Note  Date:  12/20/2014 Time:  09:10am  Group Topic/Focus:  Orientation:   The focus of this group is to educate the patient on the purpose and policies of crisis stabilization and provide a format to answer questions about their admission.  The group details unit policies and expectations of patients while admitted.  Participation Level:  Active  Participation Quality:  Appropriate  Affect:  Appropriate  Cognitive:  Appropriate  Insight: Improving  Engagement in Group:  Engaged  Modes of Intervention:  Education, Orientation and Support  Additional Comments:  Pt able to identify one daily goal to accomplish today.   Aurora Mask 12/20/2014, 10:23 AM

## 2014-12-20 NOTE — Progress Notes (Addendum)
Patient ID: Ashley Anthony, female   DOB: [birth year], 19 y.o.   MRN: [id number]  Pt currently presents with a flat affect and cooperative, pleasant behavior. Per self inventory, pt rates depression at a 6, hopelessness 2 and anxiety 3. Pt's daily goal is to "talk to social worker about after plan" and they intend to do so by "speak up." Pt reports fair sleep, a fair appetite, low energy and good concentration. Pt minimal interaction with writer, main complaint is chronic pain and some nausea.  Pt provided with medications per providers orders. Pt's labs and vitals were monitored throughout the day. Pt supported emotionally and encouraged to express concerns and questions. Pt educated on medications. Provider notified of pt nausea, prn anti-emetic ordered.  Pt's safety ensured with 15 minute and environmental checks. Pt currently denies SI/HI and A/V hallucinations. Pt verbally agrees to seek staff if SI/HI or A/VH occurs and to consult with staff before acting on these thoughts. Will continue POC.

## 2014-12-21 MED ORDER — HYDROXYZINE HCL 25 MG PO TABS
25.0000 mg | ORAL_TABLET | Freq: Four times a day (QID) | ORAL | Status: DC | PRN
Start: 2014-12-21 — End: 2014-12-22
  Administered 2014-12-21: 25 mg via ORAL
  Filled 2014-12-21: qty 1

## 2014-12-21 MED ORDER — OXYCODONE HCL 5 MG PO TABS
5.0000 mg | ORAL_TABLET | Freq: Three times a day (TID) | ORAL | Status: DC | PRN
  Administered 2014-12-21 – 2014-12-22 (×2): 5 mg via ORAL
  Filled 2014-12-21 (×2): qty 1

## 2014-12-21 NOTE — Progress Notes (Signed)
Patient ID: Ashley Anthony, female   DOB: 09/05/1995, 19 y.o.   MRN: [id number] [facility] MD Progress Note  12/21/2014 3:56 PM Ashley Anthony  MRN:  [id number] Subjective:  She states she is still depressed, but " overall OK, I guess ". Attributes ongoing depression to pain. States pain is generalized, and states " it is the same pain I normally get when my Behcet's gets worse . " States she is in the midst of an auto-immunie disorder exacerbation, which " usually last 2 -3 weeks ". States she normally takes a short course of steroids to help with resolution of symptoms , but is reluctant to do so at this time " the steroids make me gain a lot of weight, and make me really angry and irritable, I  prefer not to take them if I can help it ". States that in general she manages periods of increased pain with oxycodone for a brief period of time , stops it as pain subsides .  Objective :Patient case discussed with treatment team and patient seen. Ongoing depression, but overall mood is  Partially  improved compared to admission. She denies any suicidal ideations. Does state she feels frustrated and disheartened by having this chronic illness and pain . Visible on unit . Going to groups. Not agitated or disruptive on unit . She is tolerating current medication regimen well , denies side effects at this time. Of note, sleep is poor and patient states it is because the pain wakes her up often.  Principal Problem:  Major Depression, Recurrent , Severe, No psychotic features - consider also Depression related to Chronic Medical Condition Diagnosis:   Patient Active Problem List   Diagnosis Date Noted  . 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 Thyroid activity decreased [E03.9] 06/27/2014  . Chronic pain syndrome [G89.4] 06/27/2014  . Depression [F32.9] 06/27/2014  . Attention deficit hyperactivity  disorder [F90.9] 06/27/2014  . Insomnia [G47.00] 06/27/2014   Total Time spent with patient: 20 minutes    Past Medical History:  Past Medical History  Diagnosis Date  . Fibromyalgia   . Autoimmune disorder (HCC)   . Thyroid disease   . Asthma   . Migraine   . Behcet's syndrome (HCC)    History reviewed. No pertinent past surgical history. Family History:  Family History  Problem Relation Age of Onset  . Heart disease Father   . Cancer Maternal Aunt   . Diabetes Maternal Grandmother   . Diabetes Paternal Grandfather     Social History:  History  Alcohol Use No     History  Drug Use No    Social History   Social History  . Marital Status: Single    Spouse Name: N/A  . Number of Children: N/A  . Years of Education: N/A   Social History Main Topics  . Smoking status: Never Smoker   . Smokeless tobacco: Never Used  . Alcohol Use: No  . Drug Use: No  . Sexual Activity: Not Currently   Other Topics Concern  . None   Social History Narrative   Additional Social History:   Sleep: poor   Appetite:  Good  Current Medications: Current Facility-Administered Medications  Medication Dose Route Frequency Provider Last Rate Last Dose  . acetaminophen (TYLENOL) tablet 650 mg  650 mg Oral Q6H PRN Benjaman Pott, MD   650 mg at 12/20/14 1445  .  alum & mag hydroxide-simeth (MAALOX/MYLANTA) 200-200-20 MG/5ML suspension 30 mL  30 mL Oral Q4H PRN Benjaman Pott, MD      . ARIPiprazole (ABILIFY) tablet 2 mg  2 mg Oral Daily Craige Cotta, MD   2 mg at 12/21/14 0817  . azaTHIOprine (IMURAN) tablet 150 mg  150 mg Oral Daily Benjaman Pott, MD   150 mg at 12/21/14 0817  . FLUoxetine (PROZAC) capsule 20 mg  20 mg Oral Daily Benjaman Pott, MD   20 mg at 12/21/14 0817  . gabapentin (NEURONTIN) capsule 100 mg  100 mg Oral TID Craige Cotta, MD   100 mg at 12/21/14 1311  . hydrOXYzine (ATARAX/VISTARIL) tablet 50 mg  50 mg Oral Q6H PRN Benjaman Pott, MD   50 mg at  12/21/14 1117  . ibuprofen (ADVIL,MOTRIN) tablet 600 mg  600 mg Oral Q6H PRN Benjaman Pott, MD   600 mg at 12/21/14 1117  . levothyroxine (SYNTHROID, LEVOTHROID) tablet 100 mcg  100 mcg Oral QAC breakfast Benjaman Pott, MD   100 mcg at 12/21/14 [phone]  . magnesium hydroxide (MILK OF MAGNESIA) suspension 30 mL  30 mL Oral Daily PRN Benjaman Pott, MD      . montelukast (SINGULAIR) tablet 10 mg  10 mg Oral QHS Benjaman Pott, MD   10 mg at 12/20/14 2213  . ondansetron (ZOFRAN) tablet 4 mg  4 mg Oral Q12H PRN Sanjuana Kava, NP   4 mg at 12/20/14 1445  . polyethylene glycol (MIRALAX / GLYCOLAX) packet 17 g  17 g Oral Daily PRN Kerry Hough, PA-C      . prazosin (MINIPRESS) capsule 1 mg  1 mg Oral QHS Benjaman Pott, MD   1 mg at 12/20/14 2213  . zolpidem (AMBIEN) tablet 5 mg  5 mg Oral QHS Benjaman Pott, MD   5 mg at 12/20/14 2213    Lab Results: No results found for this or any previous visit (from the past 48 hour(s)).  Physical Findings: AIMS: Facial and Oral Movements Muscles of Facial Expression: None, normal Lips and Perioral Area: None, normal Jaw: None, normal Tongue: None, normal,Extremity Movements Upper (arms, wrists, hands, fingers): None, normal Lower (legs, knees, ankles, toes): None, normal, Trunk Movements Neck, shoulders, hips: None, normal, Overall Severity Severity of abnormal movements (highest score from questions above): None, normal Incapacitation due to abnormal movements: None, normal Patient's awareness of abnormal movements (rate only patient's report): No Awareness, Dental Status Current problems with teeth and/or dentures?: No Does patient usually wear dentures?: No  CIWA:    COWS:     Musculoskeletal: Strength & Muscle Tone: within normal limits Gait & Station: normal Patient leans: N/A  Psychiatric Specialty Exam: ROS chronic diffuse pain, denies shortness of breath, no vomiting, no rash.   Blood pressure 98/57, pulse 98, temperature 99 F  (37.2 C), temperature source Oral, resp. rate 16, height 5\' 2"  (1.575 m), weight 144 lb (65.318 kg), last menstrual period 11/17/2014, SpO2 100 %.Body mass index is 26.33 kg/(m^2).  General Appearance: improved grooming   Eye Contact::  Good  Speech:  Normal Rate  Volume:  Normal  Mood:  Depressed- some improvement compared to admission  Affect:  constricted but more reactive ,   Thought Process:  Linear  Orientation:  Full (Time, Place, and Person)  Thought Content:  denies hallucinations, no delusions, not internally preoccupied - more ruminative about her pain issues today  Suicidal Thoughts:  No at this time denies any suicidal ideations, denies self injurious ideations, contracts for safety on the unit   Homicidal Thoughts:  No  Memory:  recent and remote grossly intact   Judgement:  Other:  improved   Insight:  Present  Psychomotor Activity:  Normal  Concentration:  Good  Recall:  Good  Fund of Knowledge:Good  Language: Good  Akathisia:  Negative  Handed:  Left  AIMS (if indicated):     Assets:  Communication Skills Desire for Improvement Resilience Vocational/Educational  ADL's:  Intact  Cognition: WNL  Sleep:  Number of Hours: 2.75  Assessment - patient remains depressed, constricted , which she attributes in significant part to diffuse pain, which she states is typical of episodes of Behcet's Syndrome exacerbation, which she states is likely at the root of her worsening pain, sleep, and mood . She does not want , however, to go on steroids, as she states they make her feel worse emotionally. Although depressed, is not suicidal and remains future oriented, hoping for discharge soon.   At present denies medication side effects   Treatment Plan Summary: Daily contact with patient to assess and evaluate symptoms and progress in treatment, Medication management, Plan inpatient treatment and medication management as below   1. Continue  Neurontin 100 mgrs TID for pain and for  anxiety 2. Continue Prozac 20 mgrs QDAY for depression 3. Based on severity of pain,  And the fact its source is well documented, will start Oxycodone 5 mgrs Q 8 hours PRN for severe pain. 4. Continue  Abliify 2 mgrs QDAY as augmentation strategy for depression management - side effects discussed  5. D/C Ambien, to minimize potential excessive sedation from opiate /Zolpidem interaction 6. Continue Minipress 1 mgrs QHS for management of nightmares  7. Continue Imuran and Synthroid for management of Behcet's Syndrome and Hypothyroidism , respectively. 8. Continue to provide milieu, groups to work on symptom reduction and coping skills . 9. Treatment team working on disposition planning, patient interested in [facility] after discharge .  COBOS, FERNANDO 12/21/2014, 3:56 PM

## 2014-12-21 NOTE — Progress Notes (Signed)
Pt stated that she had a good day. Her goal is to work on discharge planning.

## 2014-12-21 NOTE — Progress Notes (Signed)
Recreation Therapy Notes  Date: 10.07.2016 Time: 9:30am Location: [address] Group room   Group Topic: Stress Management  Goal Area(s) Addresses:  Patient will actively participate in stress management techniques presented during session.   Behavioral Response: Did not attend.   Jozelyn Kuwahara L Marsden Zaino, LRT/CTRS        Fantashia Shupert L 12/21/2014 10:09 AM 

## 2014-12-21 NOTE — [facility] Group Notes (Signed)
[facility] LCSW Group Therapy 12/21/2014 1:15pm  Type of Therapy: Group Therapy- Feelings Around Relapse and Recovery  Participation Level: Active   Participation Quality:  Appropriate  Affect:  Appropriate  Cognitive: Alert and Oriented   Insight:  Developing   Engagement in Therapy: Developing/Improving and Engaged   Modes of Intervention: Clarification, Confrontation, Discussion, Education, Exploration, Limit-setting, Orientation, Problem-solving, Rapport Building, Dance movement psychotherapist, Socialization and Support  Summary of Progress/Problems: The topic for today was feelings about relapse. The group discussed what relapse prevention is to them and identified triggers that they are on the path to relapse. Members also processed their feeling towards relapse and were able to relate to common experiences. Group also discussed coping skills that can be used for relapse prevention.     Therapeutic Modalities:   Cognitive Behavioral Therapy Solution-Focused Therapy Assertiveness Training Relapse Prevention Therapy    Lamar Sprinkles [phone] 12/21/2014 5:11 PM

## 2014-12-21 NOTE — [facility] Group Notes (Signed)
[facility] LCSW Aftercare Discharge Planning Group Note  12/21/2014 8:45 AM  Participation Quality: Alert, Appropriate and Oriented  Mood/Affect: Flat  Depression Rating: 4/5  Anxiety Rating: 4/5  Thoughts of Suicide: Pt denies SI/HI  Will you contract for safety? Yes  Current AVH: Pt denies  Plan for Discharge/Comments: Pt attended discharge planning group and actively participated in group. CSW discussed suicide prevention education with the group and encouraged them to discuss discharge planning and any relevant barriers. Pt reports her pain is worse than yesterday; remains minimal but reports feeling "okay" emotionally.  Transportation Means: Pt reports access to transportation  Supports: Mother  Chad Cordial, Theresia Majors 12/21/2014 9:38 AM

## 2014-12-21 NOTE — Progress Notes (Signed)
Ashley Anthony is doing well being in he hospital. She is sad, distant and flat. She says " I hurt all of the time" and she is started on roxicodone   q 8 prn . .   A She completed her daily assessment and on it she wrote she denied SI and she rated her depresson, hopelessness and anxiety  " 4/3/4", respectively.    R POC cont.

## 2014-12-21 NOTE — Progress Notes (Signed)
Patient ID: Ashley Anthony, female   DOB: October 17, 1995, 19 y.o.   MRN: [id number] D: Client visiting with mother this evening, reports it was enjoyable. Client reports pain as "8" of 10, but refused medications until mom leaves. Client reports admission has been helpful "gotten better" "group therapy was good it was on relapse prevention and knowing your triggers" Client reports triggers as "my physical health" and confrontation, and being blamed for something I didn't do" Client reports when condition flares up and I can't get out of bed, "it gets bad" A: Writer provided emotional support, encouraged client to take medication to get relief before pain is out of control. Staff will monitor q79min for safety. R: Client is safe on the unit, attended group.

## 2014-12-22 DIAGNOSIS — F322 Major depressive disorder, single episode, severe without psychotic features: Principal | ICD-10-CM

## 2014-12-22 MED ORDER — ARIPIPRAZOLE 2 MG PO TABS: 2.0000 mg | ORAL_TABLET | Freq: Every day | ORAL | Status: DC

## 2014-12-22 NOTE — [facility] Suicide Risk Assessment (Signed)
[facility] Discharge Suicide Risk Assessment   Demographic Factors:  single  Total Time spent with patient: 20 minutes  Musculoskeletal: Strength & Muscle Tone: within normal limits Gait & Station: normal Patient leans: N/A  Psychiatric Specialty Exam: Physical Exam  Review of Systems  All other systems reviewed and are negative.   Blood pressure 98/60, pulse 96, temperature 98.7 F (37.1 C), temperature source Oral, resp. rate 16, height  (1.575 m), weight 65.318 kg (144 lb), last menstrual period 11/17/2014, SpO2 100 %.Body mass index is 26.33 kg/(m^2).  General Appearance: Casual  Eye Contact::  Good  Speech:  Clear and Coherent409  Volume:  Normal  Mood:  Euthymic  Affect:  Appropriate  Thought Process:  Coherent  Orientation:  Full (Time, Place, and Person)  Thought Content:  WDL  Suicidal Thoughts:  No  Homicidal Thoughts:  No  Memory:  Immediate;   Fair Recent;   Fair Remote;   Fair  Judgement:  Fair  Insight:  Good  Psychomotor Activity:  Normal  Concentration:  Fair  Recall:  [employer] of Knowledge:Fair  Language: Fair  Akathisia:  No  Handed:  Right  AIMS (if indicated):     Assets:  Communication Skills Desire for Improvement  Sleep:  Number of Hours: 2.75  Cognition: WNL  ADL's:  Intact   Have you used any form of tobacco in the last 30 days? (Cigarettes, Smokeless Tobacco, Cigars, and/or Pipes): No  Has this patient used any form of tobacco in the last 30 days? (Cigarettes, Smokeless Tobacco, Cigars, and/or Pipes) No  Mental Status Per Nursing Assessment::   On Admission:  Suicidal ideation indicated by patient  Current Mental Status by Physician: pt denies SI/HI/AH/VH  Loss Factors: NA  Historical Factors: Impulsivity  Risk Reduction Factors:   Living with another person, especially a relative and Positive social support  Continued Clinical Symptoms:  Previous Psychiatric Diagnoses and Treatments  Cognitive Features That Contribute  To Risk:  None    Suicide Risk:  Minimal: No identifiable suicidal ideation.  Patients presenting with no risk factors but with morbid ruminations; may be classified as minimal risk based on the severity of the depressive symptoms  Principal Problem: Severe major depression, single episode, without psychotic features [facility]) Discharge Diagnoses:  Patient Active Problem List   Diagnosis Date Noted  . 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 Thyroid activity decreased [E03.9] 06/27/2014  . Chronic pain syndrome [G89.4] 06/27/2014  . Attention deficit hyperactivity disorder [F90.9] 06/27/2014  . Insomnia [G47.00] 06/27/2014    Follow-up Information    Follow up with [facility] On 12/24/2014.   Specialty:  Behavioral Health   Why:  at 10:00am for therapy with Gunnar Fusi.   Contact information:   [address] [place] [address] [phone]       Follow up with [facility] On 01/10/2015.   Specialty:  Behavioral Health   Why:  at 10:40am for medication management with Tamela Oddi   Contact information:   [address] [place] [address] [phone]       Plan Of Care/Follow-up recommendations:  Activity:  No restrictions Diet:  regular Tests:  as needed Other:  follow up with after care  Is patient on multiple antipsychotic therapies at discharge:  No   Has Patient had three or more  failed trials of antipsychotic monotherapy by history:  No  Recommended Plan for Multiple Antipsychotic Therapies: NA    Flossie Wexler MD 12/22/2014, 9:20 AM

## 2014-12-22 NOTE — Progress Notes (Addendum)
MD completed pt's DC order and DC SRA and pt completed pt's daily assessment this morning, writing on it  She denied SI today and she rated her depression, hopelessness and  " 2/2/3", respectively. She was given her DC AVS, the contents were reviewed with her and she stated understaning and willingness to comply. She was given a prescription for her abilify and then all belongings in her locker were returned to her and she was escorted out of the bldg.

## 2014-12-22 NOTE — Discharge Summary (Signed)
Physician Discharge Summary Note  Patient:  Ashley Anthony is an 19 y.o., female MRN:  [id number] DOB:  [birth year] Patient phone:  [phone] (home)  Patient address:   [address] [place] [address],  Total Time spent with patient: 30 minutes  Date of Admission:  12/18/2014 Date of Discharge:12/22/2014  Reason for Admission:  19 year old female, who had recent suicidal thought of shooting self . States she actually held a gun in her hands , but decided to call 911. She states she had been thinking of suicide for a few days prior, but not specifically thinking of shooting self . She states she has had chronic depression, which has been worsening. She attributes her depression at least partly to medical issues - states she has a chronic medal illness . She has been diagnosed with Behcet Syndrome and with Hashimoto's Thyroiditis . She states this disease is chronic and she was originally diagnosed back in 2010. She is followed by [facility] and states she takes Prednisone and on off. Of note, she states she had been started on Prozac 2 weeks prior to admission, after 1 year or more of not being on any psychiatric medications- states she did not feel Prozac did much for her, but that her mother did feel it was helping .   Principal Problem: <principal problem not specified> Discharge Diagnoses: Patient Active Problem List   Diagnosis Date Noted  . 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 Thyroid activity decreased [E03.9] 06/27/2014  . Chronic pain syndrome [G89.4] 06/27/2014  . Depression [F32.9] 06/27/2014  . Attention deficit hyperactivity disorder [F90.9] 06/27/2014  . Insomnia [G47.00] 06/27/2014    Musculoskeletal: Strength & Muscle Tone: within normal limits Gait & Station: normal Patient leans: N/A  Psychiatric Specialty Exam: SEE MD SRA.   Physical Exam  Constitutional: She is oriented to person, place, and time. She appears well-developed.  HENT:  Head: Normocephalic.  Eyes: Pupils are equal, round, and reactive to light.  Neck: Normal range of motion.  GI: Soft.  Musculoskeletal: Normal range of motion.  Neurological: She is alert and oriented to person, place, and time.  Skin: Skin is warm and dry.  Psychiatric: She has a normal mood and affect. Her behavior is normal. Judgment and thought content normal.    Review of Systems  Musculoskeletal: Positive for myalgias and joint pain.  Psychiatric/Behavioral: Positive for depression (improved ). Negative for suicidal ideas, hallucinations and substance abuse. The patient has insomnia (improved on [place]). The patient is not nervous/anxious.     Blood pressure 98/60, pulse 96, temperature 98.7 F (37.1 C), temperature source Oral, resp. rate 16, height  (1.575 m), weight 65.318 kg (144 lb), last menstrual period 11/17/2014, SpO2 100 %.Body mass index is 26.33 kg/(m^2).   Have you used any form of tobacco in the last 30 days? (Cigarettes, Smokeless Tobacco, Cigars, and/or Pipes): No  Has this patient used any form of tobacco in the last 30 days? (Cigarettes, Smokeless Tobacco, Cigars, and/or Pipes) No  Past Medical History:  Past Medical History  Diagnosis Date  . Fibromyalgia   . Autoimmune disorder (HCC)   . Thyroid disease   . Asthma   . Migraine   . Behcet's syndrome (HCC)    History reviewed. No pertinent past surgical history. Family History:  Family History  Problem Relation Age of Onset  . Heart disease Father   .  Cancer Maternal Aunt   . Diabetes Maternal Grandmother   . Diabetes Paternal Grandfather    Social History:  History  Alcohol Use No     History  Drug Use No    Social History   Social History  . Marital Status: Single    Spouse Name: N/A  . Number of Children: N/A  . Years of Education: N/A   Social History Main Topics  .  Smoking status: Never Smoker   . Smokeless tobacco: Never Used  . Alcohol Use: No  . Drug Use: No  . Sexual Activity: Not Currently   Other Topics Concern  . None   Social History Narrative    Past Psychiatric History: Hospitalizations: None  Outpatient Care: Dr. Starling Manns at [facility]  Substance Abuse Care: None   Self-Mutilation: Hx of cutting  Suicidal Attempts: none  Violent Behaviors: None    Risk to Self: Is patient at risk for suicide?: Yes What has been your use of drugs/alcohol within the last 12 months?: Smoke THC: 1x a month Risk to Others:   Prior Inpatient Therapy:   Prior Outpatient Therapy:    Level of Care:  IOP  Hospital Course:  Ashley Anthony was admitted for Severe major depression, single episode, without psychotic features (HCC) and crisis management.  She was treated discharged with the medications listed below under Medication List.  Medical problems were identified and treated as needed.  Home medications were restarted as appropriate.  Improvement was monitored by observation and Ashley Anthony daily report of symptom reduction.  Emotional and mental status was monitored by daily self-inventory reports completed by Ashley Anthony and clinical staff.         Ashley Anthony was evaluated by the treatment team for stability and plans for continued recovery upon discharge.  Ashley Anthony motivation was an integral factor for scheduling further treatment.  Employment, transportation, bed availability, health status, family support, and any pending legal issues were also considered during her hospital stay.  She was offered further treatment options upon discharge including but not limited to Residential, Intensive Outpatient, and Outpatient treatment.  Ashley Anthony will follow up with the services as listed below under Follow Up Information.     Upon completion of this admission the patient was both mentally and medically stable for discharge denying  suicidal/homicidal ideation, auditory/visual/tactile hallucinations, delusional thoughts and paranoia.       Upon discharge she has learned a lot about coping skills, managing her anxiety and not allowing it to defeat her. I have to learn how to have a strong mind so that I can overcome my physical problems including Behcets and Hashimotos.   Consults:  None  Significant Diagnostic Studies:  None  Discharge Vitals:   Blood pressure 98/60, pulse 96, temperature 98.7 F (37.1 C), temperature source Oral, resp. rate 16, height 5\' 2"  (1.575 m), weight 65.318 kg (144 lb), last menstrual period 11/17/2014, SpO2 100 %. Body mass index is 26.33 kg/(m^2). Lab Results:   No results found for this or any previous visit (from the past 72 hour(s)).  Physical Findings: AIMS: Facial and Oral Movements Muscles of Facial Expression: None, normal Lips and Perioral Area: None, normal Jaw: None, normal Tongue: None, normal,Extremity Movements Upper (arms, wrists, hands, fingers): None, normal Lower (legs, knees, ankles, toes): None, normal, Trunk Movements Neck, shoulders, hips: None, normal, Overall Severity Severity of abnormal movements (highest score from questions above): None, normal Incapacitation due to abnormal movements: None, normal Patient's awareness of  abnormal movements (rate only patient's report): No Awareness, Dental Status Current problems with teeth and/or dentures?: No Does patient usually wear dentures?: No  CIWA:    COWS:      See Psychiatric Specialty Exam and Suicide Risk Assessment completed by Attending Physician prior to discharge.  Discharge destination:  Home  Is patient on multiple antipsychotic therapies at discharge:  No   Has Patient had three or more failed trials of antipsychotic monotherapy by history:  No  Recommended Plan for Multiple Antipsychotic Therapies: NA     Medication List    ASK your doctor about these medications      Indication    albuterol 108 (90 BASE) MCG/ACT inhaler  Commonly known as:  PROVENTIL HFA;VENTOLIN HFA  Inhale 2 puffs into the lungs every 6 (six) hours as needed for wheezing or shortness of breath.      azaTHIOprine 50 MG tablet  Commonly known as:  IMURAN  Take 150 mg by mouth daily.      cephALEXin 500 MG capsule  Commonly known as:  KEFLEX  Take 1 capsule (500 mg total) by mouth 4 (four) times daily.      FLUoxetine 10 MG capsule  Commonly known as:  PROZAC  10 mg daily      HYDROcodone-acetaminophen 5-325 MG tablet  Commonly known as:  NORCO/VICODIN  Take 1-2 tablets by mouth every 4 (four) hours as needed.      levothyroxine 100 MCG tablet  Commonly known as:  SYNTHROID, LEVOTHROID  Take 1 tablet (100 mcg total) by mouth daily before breakfast.      magic mouthwash w/lidocaine Soln  Take 5 mLs by mouth 3 (three) times daily.      methylphenidate 27 MG CR tablet  Commonly known as:  CONCERTA  27 mg daily      montelukast 10 MG tablet  Commonly known as:  SINGULAIR  10 mg at bedtime      omeprazole 20 MG capsule  Commonly known as:  PRILOSEC  Take 1 capsule (20 mg total) by mouth daily.      ondansetron 4 MG tablet  Commonly known as:  ZOFRAN  Take 1 tablet (4 mg total) by mouth every 8 (eight) hours as needed for nausea or vomiting.      ondansetron 8 MG disintegrating tablet  Commonly known as:  ZOFRAN ODT  Take 1 tablet (8 mg total) by mouth every 8 (eight) hours as needed for nausea or vomiting.      oxyCODONE-acetaminophen 5-325 MG tablet  Commonly known as:  PERCOCET/ROXICET  Take 1-2 tablets by mouth every 6 (six) hours as needed for severe pain.      prazosin 1 MG capsule  Commonly known as:  MINIPRESS  1-2 mg at bedtime      predniSONE 20 MG tablet  Commonly known as:  DELTASONE  Take 3 tablets (60 mg total) by mouth daily with breakfast. For 2 days      zolpidem 10 MG tablet  Commonly known as:  AMBIEN  10 mg at bedtime as needed for sleep             Follow-up Information    Follow up with [facility] On 12/24/2014.   Specialty:  Behavioral Health   Why:  at 10:00am for therapy with Ashley Anthony.   Contact information:   [address] [place] [address] [phone]       Follow up with [facility] On 01/10/2015.   Specialty:  Behavioral Health   Why:  at 10:40am for medication management with Ashley Anthony   Contact information:   [address] [place] [address] [phone]       Follow-up recommendations:  Activity:  Increase activity as tolerated.  Diet:  Regular diet Tests:  None at this time.   Comments:  Take all medications as prescribed. Keep all follow-up appointments as scheduled.  Do not consume alcohol or use illegal drugs while on prescription medications. Report any adverse effects from your medications to your primary care provider promptly.  In the event of recurrent symptoms or worsening symptoms, call 911, a crisis hotline, or go to the nearest emergency department for evaluation.   Total Discharge Time: Greater than 30 minutes.   Signed: Truman Hayward FNP-BC 12/22/2014, 9:09 AM

## 2014-12-22 NOTE — Plan of Care (Signed)
Problem: Alteration in mood; excessive anxiety as evidenced by: Goal: STG-Patient can identify triggers for anxiety Outcome: Progressing Client able to identify two triggers: "confrontation, being blamed for things I didn't do" "my physical health and not being able to do anything about it"

## 2014-12-22 NOTE — [facility] Group Notes (Signed)
[facility] Group Notes: (Clinical Social Work)   12/22/2014      Type of Therapy:  Group Therapy   Participation Level:  Did Not Attend - had already discharged   Ambrose Mantle, LCSW 12/22/2014, 4:10 PM

## 2014-12-22 NOTE — Plan of Care (Signed)
Problem: Diagnosis: Increased Risk For Suicide Attempt Goal: STG-Patient Will Report Suicidal Feelings to Staff Outcome: Progressing Client currently denies SI and is safe on the unit, AEB q66min safety check and verbal contract for safety.

## 2014-12-22 NOTE — Progress Notes (Signed)
  [facility] Adult Case Management Discharge Plan :  Will you be returning to the same living situation after discharge:  Yes,  with parents At discharge, do you have transportation home?: Yes,  mother Do you have the ability to pay for your medications: Yes,  no issues  Release of information consent forms completed and in the chart;  Patient's signature needed at discharge.  Patient to Follow up at: Follow-up Information    Follow up with [facility] On 12/24/2014.   Specialty:  Behavioral Health   Why:  at 10:00am for therapy with Gunnar Fusi.   Contact information:   [address] [place] [address] [phone]       Follow up with [facility] On 01/10/2015.   Specialty:  Behavioral Health   Why:  at 10:40am for medication management with Tamela Oddi   Contact information:   [address] [place] [address] [phone]       Patient denies SI/HI: Yes,  see doctor SRA    Safety Planning and Suicide Prevention discussed: Yes,  with pt and mom  Have you used any form of tobacco in the last 30 days? (Cigarettes, Smokeless Tobacco, Cigars, and/or Pipes): No  Has patient been referred to the Quitline?: N/A patient is not a smoker  Ashley Anthony 12/22/2014, 12:51 PM

## 2015-02-15 ENCOUNTER — Telehealth: Payer: Self-pay | Admitting: Clinical

## 2015-02-15 NOTE — Telephone Encounter (Signed)
Follow-up with pt, Ashley Anthony is currently seeing psychiatrist regularly, and wants fax number to [facility], so her medical records may be released for her disability case. Pt advised that our fax is [phone], and that she may call [facility] [phone] for any other questions or help she may need, and she agrees.

## 2015-03-04 ENCOUNTER — Emergency Department (HOSPITAL_COMMUNITY)
Admission: EM | Admit: 2015-03-04 | Discharge: 2015-03-04 | Disposition: A | Discharge status: Home / Self Care | Payer: Medicaid Other | Attending: Emergency Medicine | Admitting: Emergency Medicine

## 2015-03-04 ENCOUNTER — Emergency Department (HOSPITAL_COMMUNITY): Payer: Medicaid Other

## 2015-03-04 ENCOUNTER — Encounter (HOSPITAL_COMMUNITY): Payer: Self-pay | Admitting: Emergency Medicine

## 2015-03-04 DIAGNOSIS — J45909 Unspecified asthma, uncomplicated: Secondary | ICD-10-CM | POA: Diagnosis not present

## 2015-03-04 DIAGNOSIS — M549 Dorsalgia, unspecified: Secondary | ICD-10-CM

## 2015-03-04 DIAGNOSIS — Z8679 Personal history of other diseases of the circulatory system: Secondary | ICD-10-CM | POA: Diagnosis not present

## 2015-03-04 DIAGNOSIS — R300 Dysuria: Secondary | ICD-10-CM | POA: Insufficient documentation

## 2015-03-04 DIAGNOSIS — E079 Disorder of thyroid, unspecified: Secondary | ICD-10-CM | POA: Diagnosis not present

## 2015-03-04 DIAGNOSIS — M545 Low back pain: Secondary | ICD-10-CM | POA: Insufficient documentation

## 2015-03-04 DIAGNOSIS — Z79899 Other long term (current) drug therapy: Secondary | ICD-10-CM | POA: Diagnosis not present

## 2015-03-04 DIAGNOSIS — Z3202 Encounter for pregnancy test, result negative: Secondary | ICD-10-CM | POA: Insufficient documentation

## 2015-03-04 DIAGNOSIS — R350 Frequency of micturition: Secondary | ICD-10-CM | POA: Insufficient documentation

## 2015-03-04 DIAGNOSIS — R109 Unspecified abdominal pain: Secondary | ICD-10-CM | POA: Insufficient documentation

## 2015-03-04 LAB — URINE MICROSCOPIC-ADD ON: WBC UA: NONE SEEN WBC/hpf (ref 0–5)

## 2015-03-04 LAB — URINALYSIS, ROUTINE W REFLEX MICROSCOPIC
BILIRUBIN URINE: NEGATIVE
Glucose, UA: NEGATIVE mg/dL
Ketones, ur: NEGATIVE mg/dL
Leukocytes, UA: NEGATIVE
NITRITE: NEGATIVE
PH: 5.5 (ref 5.0–8.0)
Protein, ur: NEGATIVE mg/dL
SPECIFIC GRAVITY, URINE: 1.031 — AB (ref 1.005–1.030)

## 2015-03-04 LAB — I-STAT CHEM 8, ED
BUN: 11 mg/dL (ref 6–20)
CALCIUM ION: 1.21 mmol/L (ref 1.12–1.23)
CREATININE: 0.8 mg/dL (ref 0.44–1.00)
Chloride: 103 mmol/L (ref 101–111)
GLUCOSE: 86 mg/dL (ref 65–99)
HCT: 35 % — ABNORMAL LOW (ref 36.0–46.0)
HEMOGLOBIN: 11.9 g/dL — AB (ref 12.0–15.0)
POTASSIUM: 3.8 mmol/L (ref 3.5–5.1)
Sodium: 140 mmol/L (ref 135–145)
TCO2: 25 mmol/L (ref 0–100)

## 2015-03-04 LAB — PREGNANCY, URINE: PREG TEST UR: NEGATIVE

## 2015-03-04 MED ORDER — IBUPROFEN 800 MG PO TABS: 800.0000 mg | ORAL_TABLET | Freq: Three times a day (TID) | ORAL | Status: DC

## 2015-03-04 MED ORDER — IBUPROFEN 800 MG PO TABS
800.0000 mg | ORAL_TABLET | Freq: Once | ORAL | Status: AC
  Administered 2015-03-04: 800 mg via ORAL
  Filled 2015-03-04: qty 1

## 2015-03-04 NOTE — Discharge Instructions (Signed)
Your lab work and ultrasound today looked normal.  Urine sent for culture.  Follow-up with your primary care physician. Return here for new concerns.

## 2015-03-04 NOTE — ED Provider Notes (Signed)
CSN: [id number]     Arrival date & time 03/04/15  1404 History   First MD Initiated Contact with Patient 03/04/15 1533     Chief Complaint  Patient presents with  . Back Pain     (Consider location/radiation/quality/duration/timing/severity/associated sxs/prior Treatment) Patient is a 19 y.o. female presenting with back pain. The history is provided by the patient and medical records.  Back Pain Associated symptoms: dysuria    19 year old female with history of fibromyalgia, autoimmune disorder, asthma, migraines, Behcet's syndrome, presenting to the ED for back pain.  Patient states she has been having low back pain for the past week, more so on her right side. She also reports some urinary frequency and dysuria. She did start her menstrual cycle yesterday.  She denies vaginal complaints-- specifically no discharge or pelvic pain.  Patient states she is concerned she has a UTI, she has hx of same with similar symptoms.  No hx of kidney stones.  No fever, chills, sweats.  VSS.  Past Medical History  Diagnosis Date  . Fibromyalgia   . Autoimmune disorder (HCC)   . Thyroid disease   . Asthma   . Migraine   . Behcet's syndrome (HCC)    History reviewed. No pertinent past surgical history. Family History  Problem Relation Age of Onset  . Heart disease Father   . Cancer Maternal Aunt   . Diabetes Maternal Grandmother   . Diabetes Paternal Grandfather    Social History  Substance Use Topics  . Smoking status: Never Smoker   . Smokeless tobacco: Never Used  . Alcohol Use: No   OB History    No data available     Review of Systems  Genitourinary: Positive for dysuria, frequency and flank pain.  Musculoskeletal: Positive for back pain.  All other systems reviewed and are negative.     Allergies  Remicade  Home Medications   Prior to Admission medications   Medication Sig Start Date End Date Taking? Authorizing Provider  albuterol (PROVENTIL HFA;VENTOLIN HFA) 108 (90  BASE) MCG/ACT inhaler Inhale 2 puffs into the lungs every 6 (six) hours as needed for wheezing or shortness of breath.   Yes Historical Provider, MD  Alum & Mag Hydroxide-Simeth (MAGIC MOUTHWASH W/LIDOCAINE) SOLN Take 5 mLs by mouth 3 (three) times daily. 06/14/14  Yes Tiffany Netta Cedars, PA-C  ARIPiprazole (ABILIFY) 5 MG tablet Take 5 mg by mouth at bedtime. 02/26/15  Yes Historical Provider, MD  azaTHIOprine (IMURAN) 50 MG tablet Take 150 mg by mouth daily.   Yes Historical Provider, MD  cetirizine (ZYRTEC) 10 MG tablet Take 10 mg by mouth daily as needed for allergies.   Yes Historical Provider, MD  FLUoxetine (PROZAC) 10 MG capsule 10 mg daily 11/27/14  Yes Historical Provider, MD  ibuprofen (ADVIL,MOTRIN) 200 MG tablet Take 400 mg by mouth every 6 (six) hours as needed for headache.   Yes Historical Provider, MD  InFLIXimab (REMICADE IV) Inject 1 Dose into the vein every 21 ( twenty-one) days.   Yes Historical Provider, MD  levothyroxine (SYNTHROID, LEVOTHROID) 100 MCG tablet Take 1 tablet (100 mcg total) by mouth daily before breakfast. 06/14/14  Yes Tiffany Netta Cedars, PA-C  Menthol, Topical Analgesic, (ICY HOT) 5 % PADS Apply 1 patch topically daily as needed (back pain).   Yes Historical Provider, MD  methylphenidate 27 MG PO CR tablet 27 mg daily 12/03/14  Yes Historical Provider, MD  montelukast (SINGULAIR) 10 MG tablet 10 mg at bedtime 11/27/14  Yes Historical  Provider, MD  omeprazole (PRILOSEC) 20 MG capsule Take 1 capsule (20 mg total) by mouth daily. 08/08/14  Yes Azalia Bilis, MD  prazosin (MINIPRESS) 1 MG capsule 1-2 mg at bedtime as needed for anxiety 12/03/14  Yes Historical Provider, MD  zolpidem (AMBIEN) 10 MG tablet 10 mg at bedtime as needed for sleep 11/22/14  Yes Historical Provider, MD  ARIPiprazole (ABILIFY) 2 MG tablet Take 1 tablet (2 mg total) by mouth daily. 12/22/14   Truman Hayward, FNP  ondansetron (ZOFRAN ODT) 8 MG disintegrating tablet Take 1 tablet (8 mg total) by mouth every 8  (eight) hours as needed for nausea or vomiting. Patient not taking: Reported on 12/17/2014 08/16/14   Lemont Fillers Kirichenko, PA-C  predniSONE (DELTASONE) 20 MG tablet Take 3 tablets (60 mg total) by mouth daily with breakfast. For 2 days Patient taking differently: Take 10 mg by mouth daily as needed (inflammation).  08/22/14   Junius Finner, PA-C   BP 107/69 mmHg  Pulse 87  Temp(Src) 97.8 F (36.6 C) (Oral)  Resp 20  SpO2 100%  LMP 03/04/2015   Physical Exam  Constitutional: She is oriented to person, place, and time. She appears well-developed and well-nourished. No distress.  HENT:  Head: Normocephalic and atraumatic.  Mouth/Throat: Oropharynx is clear and moist.  Eyes: Conjunctivae and EOM are normal. Pupils are equal, round, and reactive to light.  Neck: Normal range of motion. Neck supple.  Cardiovascular: Normal rate, regular rhythm and normal heart sounds.   Pulmonary/Chest: Effort normal and breath sounds normal. No respiratory distress. She has no wheezes.  Abdominal: Soft. Bowel sounds are normal. There is no tenderness. There is CVA tenderness (right). There is no guarding.  Musculoskeletal: Normal range of motion.  Neurological: She is alert and oriented to person, place, and time.  Skin: Skin is warm and dry. She is not diaphoretic.  Psychiatric: She has a normal mood and affect.  Nursing note and vitals reviewed.   ED Course  Procedures (including critical care time) Labs Review Labs Reviewed  URINALYSIS, ROUTINE W REFLEX MICROSCOPIC (NOT AT [facility]) - Abnormal; Notable for the following:    APPearance CLOUDY (*)    Specific Gravity, Urine 1.031 (*)    Hgb urine dipstick SMALL (*)    All other components within normal limits  URINE MICROSCOPIC-ADD ON - Abnormal; Notable for the following:    Squamous Epithelial / LPF 6-30 (*)    Bacteria, UA RARE (*)    All other components within normal limits  I-STAT CHEM 8, ED - Abnormal; Notable for the following:    Hemoglobin  11.9 (*)    HCT 35.0 (*)    All other components within normal limits  URINE CULTURE  PREGNANCY, URINE    Imaging Review US Renal  03/04/2015  CLINICAL DATA:  19 year old female with right flank pain. Dysuria. Initial encounter. EXAM: RENAL / URINARY TRACT ULTRASOUND COMPLETE COMPARISON:  08/22/2014 CT. FINDINGS: Right Kidney: Length: 9.9 cm. Echogenicity within normal limits. No mass or hydronephrosis visualized. Left Kidney: Length: 10.6 cm. Echogenicity within normal limits. No mass or hydronephrosis visualized. Bladder: Appears normal for degree of bladder distention. IMPRESSION: Kidneys appear unremarkable. Bladder is under distended without gross abnormality. Ultrasound may in sensitive for demonstrating kidney or bladder infection. Electronically Signed   By: Lacy Duverney M.D.   On: 03/04/2015 17:10   I have personally reviewed and evaluated these images and lab results as part of my medical decision-making.   EKG Interpretation None  MDM   Final diagnoses:  Back pain, unspecified location   19 year old female here with right flank pain and urinary symptoms for the past week. Patient is afebrile, nontoxic. She has right CVA tenderness, remainder of abdominal exam is benign. No pelvic or vaginal complaints.  U/a overall non-infectious but with blood noted, however patient is currently on her menstrual.  She has no hx of renal stones and there were no stones present on CT renal study from June 2016.  SrCr remains WNL, no electrolyte imbalance noted.  Renal ultrasound negative.  No acute pathology identified today as the cause of patient's right flank pain.  Patient discharged home with Motrin, continue regular home medications. She'll follow-up with her primary care physician.  Discussed plan with patient, he/she acknowledged understanding and agreed with plan of care.  Return precautions given for new or worsening symptoms.  Garlon HatchetLisa M Andrena Margerum, PA-C 03/04/15 1808  Lorre NickAnthony Allen,  MD 03/04/15 [phone]

## 2015-03-04 NOTE — ED Notes (Signed)
Per pt, states urinary frequency and lower back pain for a week

## 2015-03-04 NOTE — ED Notes (Signed)
US at bedside

## 2015-03-06 LAB — URINE CULTURE

## 2015-06-27 ENCOUNTER — Ambulatory Visit
Admission: RE | Admit: 2015-06-27 | Discharge: 2015-06-27 | Disposition: A | Discharge status: Home / Self Care | Payer: Medicaid Other | Source: Ambulatory Visit | Attending: Family Medicine | Admitting: Family Medicine

## 2015-06-27 ENCOUNTER — Other Ambulatory Visit: Payer: Self-pay | Admitting: Family Medicine

## 2015-06-27 DIAGNOSIS — J189 Pneumonia, unspecified organism: Secondary | ICD-10-CM

## 2016-01-03 ENCOUNTER — Emergency Department (HOSPITAL_COMMUNITY): Payer: Medicaid Other

## 2016-01-03 ENCOUNTER — Emergency Department (HOSPITAL_COMMUNITY)
Admission: EM | Admit: 2016-01-03 | Discharge: 2016-01-03 | Disposition: A | Discharge status: Home / Self Care | Payer: Medicaid Other | Attending: Emergency Medicine | Admitting: Emergency Medicine

## 2016-01-03 ENCOUNTER — Encounter (HOSPITAL_COMMUNITY): Payer: Self-pay

## 2016-01-03 DIAGNOSIS — R51 Headache: Secondary | ICD-10-CM

## 2016-01-03 DIAGNOSIS — M352 Behcet's disease: Secondary | ICD-10-CM | POA: Diagnosis not present

## 2016-01-03 DIAGNOSIS — Z7951 Long term (current) use of inhaled steroids: Secondary | ICD-10-CM | POA: Insufficient documentation

## 2016-01-03 DIAGNOSIS — G44009 Cluster headache syndrome, unspecified, not intractable: Secondary | ICD-10-CM

## 2016-01-03 DIAGNOSIS — Z79899 Other long term (current) drug therapy: Secondary | ICD-10-CM | POA: Insufficient documentation

## 2016-01-03 DIAGNOSIS — R519 Headache, unspecified: Secondary | ICD-10-CM

## 2016-01-03 DIAGNOSIS — J45909 Unspecified asthma, uncomplicated: Secondary | ICD-10-CM | POA: Diagnosis not present

## 2016-01-03 LAB — BASIC METABOLIC PANEL
Anion gap: 4 — ABNORMAL LOW (ref 5–15)
BUN: 12 mg/dL (ref 6–20)
CHLORIDE: 109 mmol/L (ref 101–111)
CO2: 28 mmol/L (ref 22–32)
Calcium: 8.5 mg/dL — ABNORMAL LOW (ref 8.9–10.3)
Creatinine, Ser: 0.73 mg/dL (ref 0.44–1.00)
GFR calc Af Amer: >60 mL/min (ref >60)
GFR calc non Af Amer: >60 mL/min (ref >60)
GLUCOSE: 90 mg/dL (ref 65–99)
POTASSIUM: 3.9 mmol/L (ref 3.5–5.1)
Sodium: 141 mmol/L (ref 135–145)

## 2016-01-03 LAB — CBC WITH DIFFERENTIAL/PLATELET
Basophils Absolute: 0 10*3/uL (ref 0.0–0.1)
Basophils Relative: 0 %
EOS PCT: 2 %
Eosinophils Absolute: 0.1 10*3/uL (ref 0.0–0.7)
HCT: 28 % — ABNORMAL LOW (ref 36.0–46.0)
HEMOGLOBIN: 9.4 g/dL — AB (ref 12.0–15.0)
LYMPHS ABS: 1.6 10*3/uL (ref 0.7–4.0)
LYMPHS PCT: 31 %
MCH: 31.8 pg (ref 26.0–34.0)
MCHC: 33.6 g/dL (ref 30.0–36.0)
MCV: 94.6 fL (ref 78.0–100.0)
Monocytes Absolute: 0.2 10*3/uL (ref 0.1–1.0)
Monocytes Relative: 4 %
NEUTROS PCT: 63 %
Neutro Abs: 3.2 10*3/uL (ref 1.7–7.7)
Platelets: 302 10*3/uL (ref 150–400)
RBC: 2.96 MIL/uL — AB (ref 3.87–5.11)
RDW: 17.4 % — ABNORMAL HIGH (ref 11.5–15.5)
WBC: 5.1 10*3/uL (ref 4.0–10.5)

## 2016-01-03 LAB — I-STAT CHEM 8, ED
BUN: 16 mg/dL (ref 6–20)
CALCIUM ION: 1.15 mmol/L (ref 1.15–1.40)
CHLORIDE: 102 mmol/L (ref 101–111)
Creatinine, Ser: 0.8 mg/dL (ref 0.44–1.00)
GLUCOSE: 92 mg/dL (ref 65–99)
HCT: 26 % — ABNORMAL LOW (ref 36.0–46.0)
Hemoglobin: 8.8 g/dL — ABNORMAL LOW (ref 12.0–15.0)
Potassium: 3.9 mmol/L (ref 3.5–5.1)
Sodium: 144 mmol/L (ref 135–145)
TCO2: 27 mmol/L (ref 0–100)

## 2016-01-03 LAB — I-STAT BETA HCG BLOOD, ED (MC, WL, AP ONLY): I-stat hCG, quantitative: <5 m[IU]/mL

## 2016-01-03 LAB — SEDIMENTATION RATE: Sed Rate: 25 mm/hr — ABNORMAL HIGH (ref 0–22)

## 2016-01-03 MED ORDER — PROCHLORPERAZINE EDISYLATE 5 MG/ML IJ SOLN
5.0000 mg | Freq: Once | INTRAMUSCULAR | Status: AC
  Administered 2016-01-03: 5 mg via INTRAVENOUS
  Filled 2016-01-03: qty 2

## 2016-01-03 MED ORDER — GADOBENATE DIMEGLUMINE 529 MG/ML IV SOLN
15.0000 mL | Freq: Once | INTRAVENOUS | Status: AC | PRN
  Administered 2016-01-03: 13 mL via INTRAVENOUS

## 2016-01-03 MED ORDER — DEXAMETHASONE SODIUM PHOSPHATE 10 MG/ML IJ SOLN
10.0000 mg | Freq: Once | INTRAMUSCULAR | Status: AC
  Administered 2016-01-03: 10 mg via INTRAVENOUS
  Filled 2016-01-03: qty 1

## 2016-01-03 MED ORDER — SODIUM CHLORIDE 0.9 % IV BOLUS (SEPSIS)
1000.0000 mL | Freq: Once | INTRAVENOUS | Status: AC
Start: 2016-01-03 — End: 2016-01-03
  Administered 2016-01-03: 1000 mL via INTRAVENOUS

## 2016-01-03 MED ORDER — BUTALBITAL-APAP-CAFFEINE 50-325-40 MG PO TABS: 1.0000 | ORAL_TABLET | Freq: Three times a day (TID) | ORAL | 0 refills | Status: DC | PRN

## 2016-01-03 MED ORDER — MORPHINE SULFATE (PF) 4 MG/ML IV SOLN
4.0000 mg | Freq: Once | INTRAVENOUS | Status: AC
  Administered 2016-01-03: 4 mg via INTRAVENOUS
  Filled 2016-01-03: qty 1

## 2016-01-03 MED ORDER — ONDANSETRON HCL 4 MG/2ML IJ SOLN
4.0000 mg | Freq: Once | INTRAMUSCULAR | Status: AC
  Administered 2016-01-03: 4 mg via INTRAVENOUS
  Filled 2016-01-03: qty 2

## 2016-01-03 MED ORDER — ONDANSETRON HCL 4 MG/2ML IJ SOLN
4.0000 mg | Freq: Once | INTRAMUSCULAR | Status: DC
  Filled 2016-01-03: qty 2

## 2016-01-03 MED ORDER — DIPHENHYDRAMINE HCL 50 MG/ML IJ SOLN
25.0000 mg | Freq: Once | INTRAMUSCULAR | Status: AC
  Administered 2016-01-03: 25 mg via INTRAVENOUS
  Filled 2016-01-03: qty 1

## 2016-01-03 MED ORDER — SODIUM CHLORIDE 0.9 % IV BOLUS (SEPSIS)
1000.0000 mL | Freq: Once | INTRAVENOUS | Status: AC
  Administered 2016-01-03: 1000 mL via INTRAVENOUS

## 2016-01-03 NOTE — ED Notes (Signed)
Patient transported to CT 

## 2016-01-03 NOTE — ED Triage Notes (Signed)
Pt also returned earlier Wednesday from a 5 hour flight

## 2016-01-03 NOTE — ED Triage Notes (Signed)
Pt complains of a headache at the base of her neck and vomiting for two days, she receives infusions for Behcet's Syndrome and last one was Wednesday,

## 2016-01-03 NOTE — ED Provider Notes (Signed)
[facility]-EMERGENCY DEPT Provider Note   CSN: [id number] Arrival date & time: 01/03/16  0701     History   Chief Complaint Chief Complaint  Patient presents with  . Headache    HPI Ashley Anthony is a 20 y.o. female with a past medical history of Behcet's syndrome, and other autoimmune disorders who is followed at [facility] rheumatology. She presents emergency Department with chief complaint of severe headache. Patient states that she had an infusion of Remicade 2 days ago. She has a history of anaphylaxis to Remicade, which requires pretreatment with IV steroids and Benadryl. She states that her infusion went off without any problems. Yesterday she developed a progressively worsening headaches. Around 3 this morning she woke up her mother stating "I really need your help." She is complaining of a severe headache, nausea, photophobia. Patient was given Excedrin Migraine by her mother. No resolution in her symptoms.,She was brought into the emergency department after projectile vomiting. She denies fevers. She has a history of severe headaches. However, this is the worst. She's ever had.  Denies stiff neck, neck pain, rash, or "thunderclap" onset.    HPI  Past Medical History:  Diagnosis Date  . Asthma   . Autoimmune disorder (HCC)   . Behcet's syndrome (HCC)   . Fibromyalgia   . Migraine   . Thyroid disease     Patient Active Problem List   Diagnosis Date Noted  . Severe major depression, single episode, without psychotic features (HCC) 12/18/2014    Class: Acute  . Behcet's syndrome (HCC) 06/27/2014  . Thyroid activity decreased 06/27/2014  . Chronic pain syndrome 06/27/2014  . Attention deficit hyperactivity disorder 06/27/2014  . Insomnia 06/27/2014    History reviewed. No pertinent surgical history.  OB History    No data available       Home Medications    Prior to Admission medications   Medication Sig Start Date End Date Taking? Authorizing Provider    albuterol (PROVENTIL HFA;VENTOLIN HFA) 108 (90 BASE) MCG/ACT inhaler Inhale 2 puffs into the lungs every 6 (six) hours as needed for wheezing or shortness of breath.    Historical Provider, MD  Alum & Mag Hydroxide-Simeth (MAGIC MOUTHWASH W/LIDOCAINE) SOLN Take 5 mLs by mouth 3 (three) times daily. 06/14/14   Tiffany Netta Cedars, PA-C  ARIPiprazole (ABILIFY) 2 MG tablet Take 1 tablet (2 mg total) by mouth daily. 12/22/14   Truman Hayward, FNP  ARIPiprazole (ABILIFY) 5 MG tablet Take 5 mg by mouth at bedtime. 02/26/15   Historical Provider, MD  azaTHIOprine (IMURAN) 50 MG tablet Take 150 mg by mouth daily.    Historical Provider, MD  cetirizine (ZYRTEC) 10 MG tablet Take 10 mg by mouth daily as needed for allergies.    Historical Provider, MD  FLUoxetine (PROZAC) 10 MG capsule 10 mg daily 11/27/14   Historical Provider, MD  ibuprofen (ADVIL,MOTRIN) 800 MG tablet Take 1 tablet (800 mg total) by mouth 3 (three) times daily. 03/04/15   Garlon Hatchet, PA-C  InFLIXimab (REMICADE IV) Inject 1 Dose into the vein every 21 ( twenty-one) days.    Historical Provider, MD  levothyroxine (SYNTHROID, LEVOTHROID) 100 MCG tablet Take 1 tablet (100 mcg total) by mouth daily before breakfast. 06/14/14   Vivianne Master, PA-C  Menthol, Topical Analgesic, (ICY HOT) 5 % PADS Apply 1 patch topically daily as needed (back pain).    Historical Provider, MD  methylphenidate 27 MG PO CR tablet 27 mg daily 12/03/14   Historical  Provider, MD  montelukast (SINGULAIR) 10 MG tablet 10 mg at bedtime 11/27/14   Historical Provider, MD  omeprazole (PRILOSEC) 20 MG capsule Take 1 capsule (20 mg total) by mouth daily. 08/08/14   Azalia BilisKevin Campos, MD  ondansetron (ZOFRAN ODT) 8 MG disintegrating tablet Take 1 tablet (8 mg total) by mouth every 8 (eight) hours as needed for nausea or vomiting. Patient not taking: Reported on 12/17/2014 08/16/14   Jaynie Crumbleatyana Kirichenko, PA-C  prazosin (MINIPRESS) 1 MG capsule 1-2 mg at bedtime as needed for anxiety 12/03/14    Historical Provider, MD  predniSONE (DELTASONE) 20 MG tablet Take 3 tablets (60 mg total) by mouth daily with breakfast. For 2 days Patient taking differently: Take 10 mg by mouth daily as needed (inflammation).  08/22/14   Junius FinnerErin O'Malley, PA-C  zolpidem (AMBIEN) 10 MG tablet 10 mg at bedtime as needed for sleep 11/22/14   Historical Provider, MD    Family History Family History  Problem Relation Age of Onset  . Heart disease Father   . Cancer Maternal Aunt   . Diabetes Maternal Grandmother   . Diabetes Paternal Grandfather     Social History Social History  Substance Use Topics  . Smoking status: Never Smoker  . Smokeless tobacco: Never Used  . Alcohol use No     Allergies   Remicade [infliximab]   Review of Systems Review of Systems Ten systems reviewed and are negative for acute change, except as noted in the HPI.    Physical Exam Updated Vital Signs BP 107/66 (BP Location: Left Arm)   Pulse 66   Temp 98.1 F (36.7 C) (Oral)   Resp 18   Ht 5\' 2"  (1.575 m)   Wt 67.1 kg   LMP 12/09/2015   SpO2 100%   BMI 27.07 kg/m   Physical Exam  Constitutional: She is oriented to person, place, and time. She appears well-developed and well-nourished. No distress.  HENT:  Head: Normocephalic and atraumatic.  Right Ear: External ear normal.  Left Ear: External ear normal.  Mouth/Throat: No oropharyngeal exudate.  Eyes: Conjunctivae and EOM are normal. Pupils are equal, round, and reactive to light. No scleral icterus.  Photophobic on exam  Neck: Normal range of motion. Neck supple. No JVD present. No thyromegaly present.  Cardiovascular: Normal rate, regular rhythm, normal heart sounds and intact distal pulses.  Exam reveals no gallop and no friction rub.   No murmur heard. Pulmonary/Chest: Effort normal and breath sounds normal. No respiratory distress.  Abdominal: Soft. Bowel sounds are normal. She exhibits no distension and no mass. There is no tenderness. There is no  guarding.  Musculoskeletal: Normal range of motion. She exhibits no edema or tenderness.  No meningismus  Neurological: She is alert and oriented to person, place, and time. She has normal reflexes. No cranial nerve deficit. Coordination normal.  Skin: Skin is warm and dry. No rash noted. She is not diaphoretic.  Nursing note and vitals reviewed.    ED Treatments / Results  Labs (all labs ordered are listed, but only abnormal results are displayed) Labs Reviewed - No data to display  EKG  EKG Interpretation None       Radiology No results found.  Procedures Procedures (including critical care time)  Medications Ordered in ED Medications - No data to display   Initial Impression / Assessment and Plan / ED Course  I have reviewed the triage vital signs and the nursing notes.  Pertinent labs & imaging results that were available  during my care of the patient were reviewed by me and considered in my medical decision making (see chart for details).  Clinical Course  Value Comment By Time  Hemoglobin: (!) 9.4 Mild anemia Arthor Captain, PA-C 10/20 0950   Reeval of patient. She states that her head is "killing me.!" I have ordered a migraine cocktail Arthor Captain, PA-C 10/20 0950   Pain is improved, but patient is vomiting again. HA now 6/10  Arthor Captain, PA-C 10/20 1027   I spoke with The patient's Rheumatologist at [facility] Dr.Criscione  who reccomends MRI, MRA Lumbar puncture with oligoclonal band and regular CSF anlaysis.  Arthor Captain, PA-C 10/20 1326     Patient headache greatly improved.  Patient does NOT wish to proceed with LP and there is no suspected infectious cause at this time. She will f/u with [facility] for further evaluation if inflammatory process is suspected. Appears safe for discharge at this time. Discussed return precautions. Patient seen in shared visit with attending physician. Who agrees with assessment, work up , treatment, and plan for  discharge.    Final Clinical Impressions(s) / ED Diagnoses   Final diagnoses:  None    New Prescriptions New Prescriptions   No medications on file     Arthor Captain, PA-C 01/09/16 1134    Lavera Guise, MD 01/09/16 1146

## 2016-01-03 NOTE — ED Notes (Signed)
RN to obtain labs when sticking patient for IV

## 2016-01-03 NOTE — Discharge Instructions (Signed)
Please follow up with [facility] as soon as possible with [facility] if you have any worsening of your headache. Keep your appointment with Dr. Lysle Rubensriscione on Nov. 4   You are having a headache. No specific cause was found today for your headache. It may have been a migraine or other cause of headache. Stress, anxiety, fatigue, and depression are common triggers for headaches. Your headache today does not appear to be life-threatening or require hospitalization, but often the exact cause of headaches is not determined in the emergency department. Therefore, follow-up with your doctor is very important to find out what may have caused your headache, and whether or not you need any further diagnostic testing or treatment. Sometimes headaches can appear benign (not harmful), but then more serious symptoms can develop which should prompt an immediate re-evaluation by your doctor or the emergency department. SEEK MEDICAL ATTENTION IF: You develop possible problems with medications prescribed.  The medications don't resolve your headache, if it recurs , or if you have multiple episodes of vomiting or can't take fluids. You have a change from the usual headache. RETURN IMMEDIATELY IF you develop a sudden, severe headache or confusion, become poorly responsive or faint, develop a fever above 100.65F or problem breathing, have a change in speech, vision, swallowing, or understanding, or develop new weakness, numbness, tingling, incoordination, or have a seizure.

## 2016-01-03 NOTE — ED Notes (Signed)
PA at bedside.

## 2016-01-04 NOTE — ED Provider Notes (Signed)
Medical screening examination/treatment/procedure(s) were conducted as a shared visit with non-physician practitioner(s) and myself.  I personally evaluated the patient during the encounter.   EKG Interpretation None      20 year old female who presents with headache. History of Behcet's syndrome followed [facility]. Receiving Remicade infusions. History of headaches when she was younger and was followed by neurologist. States 2 days of headache, progressively worsening, at the back of the neck associated with nausea and vomiting. No confusion, fever or chills, focal numbness/weakness, vision or speech changes. In ED is afebrile, with normal vital signs. No meningismus. No focal neuro deficits. No suspicion for infectious etiology such as encephalitis or meningitis. History is not suggestive of SAH. CT head visualized and without acute intracranial processes.  Spoke with Dr. Lysle Rubensriscione on phone regarding concern for potential neuro-Bechet's. She is recommending MRI and potentially LP. MRI performed, visualized and negative for any acute processes. Inflammatory markers in blood work improving. Patient declines LP after risk/benefits discussion. Spoke w/ Dr. Lysle Rubensriscione again. Deferring LP okay at this time, but she may require it as outpatient if progressive headache. At this time, treat as migraine type headache with close Rheumatology follow-up 01/18/2016. At this time, do not need steroids or immunemodulating treatments for potential Bechet's flare. Patient did receive migraine treatments with some improvement in symptoms. Plan discussed with mother and patient who was agreeable with discharge and close rheumatology follow-up. Strict return and follow-up instructions reviewed. She expressed understanding of all discharge instructions and felt comfortable with the plan of care.    Lavera Guiseana Duo Liu, MD 01/04/16 [phone]

## 2016-06-25 ENCOUNTER — Encounter (HOSPITAL_COMMUNITY): Payer: Self-pay | Admitting: Emergency Medicine

## 2016-06-25 ENCOUNTER — Emergency Department (HOSPITAL_COMMUNITY)
Admission: EM | Admit: 2016-06-25 | Discharge: 2016-06-25 | Disposition: A | Discharge status: Home / Self Care | Payer: Medicaid Other | Attending: Emergency Medicine | Admitting: Emergency Medicine

## 2016-06-25 DIAGNOSIS — R531 Weakness: Secondary | ICD-10-CM

## 2016-06-25 DIAGNOSIS — E063 Autoimmune thyroiditis: Secondary | ICD-10-CM | POA: Insufficient documentation

## 2016-06-25 DIAGNOSIS — J45909 Unspecified asthma, uncomplicated: Secondary | ICD-10-CM | POA: Insufficient documentation

## 2016-06-25 DIAGNOSIS — R112 Nausea with vomiting, unspecified: Secondary | ICD-10-CM | POA: Insufficient documentation

## 2016-06-25 DIAGNOSIS — E038 Other specified hypothyroidism: Secondary | ICD-10-CM | POA: Insufficient documentation

## 2016-06-25 LAB — URINALYSIS, ROUTINE W REFLEX MICROSCOPIC
Bacteria, UA: NONE SEEN
Bilirubin Urine: NEGATIVE
Glucose, UA: NEGATIVE mg/dL
Ketones, ur: NEGATIVE mg/dL
Leukocytes, UA: NEGATIVE
Nitrite: NEGATIVE
Protein, ur: NEGATIVE mg/dL
Specific Gravity, Urine: 1.015 (ref 1.005–1.030)
pH: 5 (ref 5.0–8.0)

## 2016-06-25 LAB — BASIC METABOLIC PANEL WITH GFR
Anion gap: 5 (ref 5–15)
BUN: 10 mg/dL (ref 6–20)
CO2: 24 mmol/L (ref 22–32)
Calcium: 8.6 mg/dL — ABNORMAL LOW (ref 8.9–10.3)
Chloride: 108 mmol/L (ref 101–111)
Creatinine, Ser: 0.79 mg/dL (ref 0.44–1.00)
GFR calc Af Amer: >60 mL/min
GFR calc non Af Amer: >60 mL/min
Glucose, Bld: 94 mg/dL (ref 65–99)
Potassium: 4 mmol/L (ref 3.5–5.1)
Sodium: 137 mmol/L (ref 135–145)

## 2016-06-25 LAB — CBC
HCT: 30.6 % — ABNORMAL LOW (ref 36.0–46.0)
Hemoglobin: 10.7 g/dL — ABNORMAL LOW (ref 12.0–15.0)
MCH: 33.8 pg (ref 26.0–34.0)
MCHC: 35 g/dL (ref 30.0–36.0)
MCV: 96.5 fL (ref 78.0–100.0)
Platelets: 470 K/uL — ABNORMAL HIGH (ref 150–400)
RBC: 3.17 MIL/uL — ABNORMAL LOW (ref 3.87–5.11)
RDW: 14.9 % (ref 11.5–15.5)
WBC: 4.7 K/uL (ref 4.0–10.5)

## 2016-06-25 LAB — PREGNANCY, URINE: Preg Test, Ur: NEGATIVE

## 2016-06-25 LAB — TSH: TSH: 15.661 u[IU]/mL — AB (ref 0.350–4.500)

## 2016-06-25 MED ORDER — LEVOTHYROXINE SODIUM 100 MCG PO TABS
100.0000 ug | ORAL_TABLET | Freq: Every day | ORAL | Status: DC
  Administered 2016-06-25: 100 ug via ORAL
  Filled 2016-06-25: qty 1

## 2016-06-25 MED ORDER — SODIUM CHLORIDE 0.9 % IV BOLUS (SEPSIS)
1000.0000 mL | Freq: Once | INTRAVENOUS | Status: AC
  Administered 2016-06-25: 1000 mL via INTRAVENOUS

## 2016-06-25 MED ORDER — LEVOTHYROXINE SODIUM 100 MCG PO TABS: 100.0000 ug | ORAL_TABLET | Freq: Every day | ORAL | 0 refills | Status: AC

## 2016-06-25 MED ORDER — ONDANSETRON HCL 4 MG/2ML IJ SOLN
4.0000 mg | Freq: Once | INTRAMUSCULAR | Status: AC
  Administered 2016-06-25: 4 mg via INTRAVENOUS
  Filled 2016-06-25: qty 2

## 2016-06-25 MED ORDER — IBUPROFEN 800 MG PO TABS
800.0000 mg | ORAL_TABLET | Freq: Once | ORAL | Status: AC
  Administered 2016-06-25: 800 mg via ORAL
  Filled 2016-06-25: qty 1

## 2016-06-25 MED ORDER — KETOROLAC TROMETHAMINE 30 MG/ML IJ SOLN
30.0000 mg | Freq: Once | INTRAMUSCULAR | Status: AC
  Administered 2016-06-25: 30 mg via INTRAVENOUS
  Filled 2016-06-25: qty 1

## 2016-06-25 MED ORDER — PROMETHAZINE HCL 25 MG/ML IJ SOLN
25.0000 mg | Freq: Once | INTRAMUSCULAR | Status: AC
  Administered 2016-06-25: 25 mg via INTRAVENOUS
  Filled 2016-06-25: qty 1

## 2016-06-25 MED ORDER — ONDANSETRON 4 MG PO TBDP: 4.0000 mg | ORAL_TABLET | Freq: Three times a day (TID) | ORAL | 0 refills | Status: AC | PRN

## 2016-06-25 NOTE — ED Notes (Signed)
Pt is aware urine sample is needed. 

## 2016-06-25 NOTE — ED Provider Notes (Signed)
[facility]ER[facility] DEPT Provider Note   CSN: [id number] Arrival date & time: 06/25/16  0120     History   Chief Complaint Chief Complaint  Patient presents with  . Weakness  . Thyroid Issues    HPI Ashley Anthony is a 21 y.o. female.  Patient with history of hypothyroidism, Behcet's syndrome, fibromyalgia, on Remicade, followed at [facility] -- presents with complaint of generalized weakness and fatigue over the past week and a half. Patient states that she stopped taking her levothyroxine approximately one month ago she was unable to get her prescription refilled. She has had nausea but no vomiting. No fevers or URI symptoms. No chest pain or shortness of breath. No syncope. No diarrhea or urinary symptoms. Her skin is very dry with no rashes. She is due for another Remicade infusion in 8 days. Denies heavy vaginal bleeding or other bleeding. She does have a history of low blood counts. The onset of this condition was acute. The course is constant. Aggravating factors: none. Alleviating factors: none.        Past Medical History:  Diagnosis Date  . Asthma   . Autoimmune disorder (HCC)   . Behcet's syndrome (HCC)   . Fibromyalgia   . Migraine   . Thyroid disease    Hashimoto's    Patient Active Problem List   Diagnosis Date Noted  . Severe major depression, single episode, without psychotic features (HCC) 12/18/2014    Class: Acute  . Behcet's syndrome (HCC) 06/27/2014  . Thyroid activity decreased 06/27/2014  . Chronic pain syndrome 06/27/2014  . Attention deficit hyperactivity disorder 06/27/2014  . Insomnia 06/27/2014    History reviewed. No pertinent surgical history.  OB History    No data available       Home Medications    Prior to Admission medications   Medication Sig Start Date End Date Taking? Authorizing Provider  albuterol (PROVENTIL HFA;VENTOLIN HFA) 108 (90 BASE) MCG/ACT inhaler Inhale 2 puffs into the lungs every 6 (six) hours as needed for wheezing  or shortness of breath.   Yes Historical Provider, MD  azaTHIOprine (IMURAN) 50 MG tablet Take 150 mg by mouth daily.   Yes Historical Provider, MD  InFLIXimab (REMICADE IV) Inject 1 Dose into the vein every 21 ( twenty-one) days.   Yes Historical Provider, MD  levothyroxine (SYNTHROID, LEVOTHROID) 100 MCG tablet Take 1 tablet (100 mcg total) by mouth daily before breakfast. 06/14/14  Yes Tiffany S Noel, PA-C  ondansetron (ZOFRAN) 4 MG tablet Take 4 mg by mouth every 8 (eight) hours as needed for nausea or vomiting.   Yes Historical Provider, MD  predniSONE (DELTASONE) 20 MG tablet Take 3 tablets (60 mg total) by mouth daily with breakfast. For 2 days Patient taking differently: Take 10 mg by mouth daily as needed (inflammation). Patient last took 20 mg last week 08/22/14  Yes Junius Finner, PA-C  Alum & Mag Hydroxide-Simeth (MAGIC MOUTHWASH W/LIDOCAINE) SOLN Take 5 mLs by mouth 3 (three) times daily. Patient not taking: Reported on 01/03/2016 06/14/14   Vivianne Master, PA-C  ARIPiprazole (ABILIFY) 2 MG tablet Take 1 tablet (2 mg total) by mouth daily. Patient not taking: Reported on 01/03/2016 12/22/14   Truman Hayward, FNP  butalbital-acetaminophen-caffeine (FIORICET, ESGIC) [phone] MG tablet Take 1-2 tablets by mouth every 8 (eight) hours as needed for headache. Patient not taking: Reported on 06/25/2016 01/03/16 01/02/17  Arthor Captain, PA-C  ibuprofen (ADVIL,MOTRIN) 800 MG tablet Take 1 tablet (800 mg total) by mouth 3 (three)  times daily. Patient not taking: Reported on 01/03/2016 03/04/15   Garlon Hatchet, PA-C  omeprazole (PRILOSEC) 20 MG capsule Take 1 capsule (20 mg total) by mouth daily. Patient not taking: Reported on 01/03/2016 08/08/14   Azalia Bilis, MD  ondansetron [facility] ODT) 8 MG disintegrating tablet Take 1 tablet (8 mg total) by mouth every 8 (eight) hours as needed for nausea or vomiting. Patient not taking: Reported on 12/17/2014 08/16/14   Jaynie Crumble, PA-C    Family  History Family History  Problem Relation Age of Onset  . Heart disease Father   . Cancer Maternal Aunt   . Diabetes Maternal Grandmother   . Diabetes Paternal Grandfather     Social History Social History  Substance Use Topics  . Smoking status: Never Smoker  . Smokeless tobacco: Never Used  . Alcohol use No     Allergies   Remicade [infliximab]   Review of Systems Review of Systems  Constitutional: Negative for fever.  HENT: Negative for rhinorrhea and sore throat.   Eyes: Negative for redness.  Respiratory: Negative for cough.   Cardiovascular: Negative for chest pain.  Gastrointestinal: Positive for nausea. Negative for abdominal pain, diarrhea and vomiting.  Genitourinary: Negative for dysuria and vaginal bleeding.  Musculoskeletal: Negative for myalgias.  Skin: Negative for rash.  Neurological: Positive for weakness. Negative for syncope and headaches.  Hematological: Does not bruise/bleed easily.     Physical Exam Updated Vital Signs BP 95/69   Pulse 71   Temp 98.3 F (36.8 C) (Oral)   Resp 15   Ht  (1.575 m)   Wt 69.9 kg   SpO2 99%   BMI 28.17 kg/m   Physical Exam  Constitutional: She appears well-developed and well-nourished.  HENT:  Head: Normocephalic and atraumatic.  Mouth/Throat: Oropharynx is clear and moist. No oropharyngeal exudate.  No significant intraoral lesions.   Eyes: Conjunctivae are normal. Right eye exhibits no discharge. Left eye exhibits no discharge.  Neck: Normal range of motion. Neck supple.  Cardiovascular: Normal rate, regular rhythm and normal heart sounds.   No murmur heard. Pulmonary/Chest: Effort normal and breath sounds normal. No respiratory distress. She has no wheezes. She has no rales.  Abdominal: Soft. There is no tenderness. There is no rebound and no guarding.  Neurological: She is alert.  Skin: Skin is warm and dry.  Psychiatric: She has a normal mood and affect.  Nursing note and vitals  reviewed.    ED Treatments / Results  Labs (all labs ordered are listed, but only abnormal results are displayed) Labs Reviewed  BASIC METABOLIC PANEL - Abnormal; Notable for the following:       Result Value   Calcium 8.6 (*)    All other components within normal limits  CBC - Abnormal; Notable for the following:    RBC 3.17 (*)    Hemoglobin 10.7 (*)    HCT 30.6 (*)    Platelets 470 (*)    All other components within normal limits  TSH - Abnormal; Notable for the following:    TSH 15.661 (*)    All other components within normal limits  URINALYSIS, ROUTINE W REFLEX MICROSCOPIC  PREGNANCY, URINE    Procedures Procedures (including critical care time)  Medications Ordered in ED Medications  levothyroxine (SYNTHROID, LEVOTHROID) tablet 100 mcg (not administered)  sodium chloride 0.9 % bolus 1,000 mL (1,000 mLs Intravenous New Bag/Given 06/25/16 0710)  promethazine (PHENERGAN) injection 25 mg (not administered)  sodium chloride 0.9 % bolus  1,000 mL (0 mLs Intravenous Stopped 06/25/16 0600)  ondansetron (ZOFRAN) injection 4 mg (4 mg Intravenous Given 06/25/16 0445)  ketorolac (TORADOL) 30 MG/ML injection 30 mg (30 mg Intravenous Given 06/25/16 0445)     Initial Impression / Assessment and Plan / ED Course  I have reviewed the triage vital signs and the nursing notes.  Pertinent labs & imaging results that were available during my care of the patient were reviewed by me and considered in my medical decision making (see chart for details).     Patient seen and examined. Work-up initiated. Medications/fluids ordered.   Vital signs reviewed and are as follows: BP (!) 86/44   Pulse 80   Temp 98.3 F (36.8 C) (Oral)   Resp 20   Ht  (1.575 m)   Wt 69.9 kg   SpO2 98%   BMI 28.17 kg/m   Patient discussed with Dr. Read Drivers. Hypothyroid likely cause of symptoms, will restart Levothyroxine, ordered. Patient updated and aware.  7:14 AM patient slept. She states she still  unable to use the restroom. Second liter of fluid ordered. Patient is somewhat hypertensive when she is sleeping.  She continues to be very nauseous and thinks that she may throw up. IV Phenergan ordered.  Handoff to [employer] at shift change he will follow-up urine results.  Plan: Continue symptom control. Feel that her symptoms are improved, patient can likely go home. She agrees to call her rheumatologist today who manages her levothyroxine. She'll be discharged home with a prescription for this and nausea medication.  If vomiting is intractable, with need admission for hypothyroidism and vomiting.    Final Clinical Impressions(s) / ED Diagnoses   Final diagnoses:  Hypothyroidism due to Hashimoto's thyroiditis  Generalized weakness  Nausea and vomiting, intractability of vomiting not specified, unspecified vomiting type   Patient with approximately 10 days of generalized weakness after a month of not taking levothyroxine. Her symptoms are consistent with hypothyroidism. She does not have any apparent neurologic dysfunction. Heart rate is 60-80. Blood pressure slightly low. No definitive indications for admission at this time but continuing to control symptoms. Plan currently as above. Patient appears well, nontoxic.  New Prescriptions New Prescriptions   LEVOTHYROXINE (SYNTHROID, LEVOTHROID) 100 MCG TABLET    Take 1 tablet (100 mcg total) by mouth daily before breakfast.   ONDANSETRON (ZOFRAN ODT) 4 MG DISINTEGRATING TABLET    Take 1 tablet (4 mg total) by mouth every 8 (eight) hours as needed for nausea or vomiting.     Renne Crigler, PA-C 06/25/16 9562    Paula Libra, MD 06/25/16 2239

## 2016-06-25 NOTE — ED Provider Notes (Signed)
Assumed care from PA Geiple at shift change.  Briefly 21 y.o. F here with weakness.  Hx of thyroid issues, has been off her meds recently.  TSH here elevated as expected.  Remainder of lab work overall reassuring.    Plan:  Given IVF and anti-emetics.  Will await UA and urine preg.  If negative, can discharge and have her follow-up with her rheumatologist.  Will re-start her synthroid.  Results for orders placed or performed during the hospital encounter of 06/25/16  Basic metabolic panel  Result Value Ref Range   Sodium 137 135 - 145 mmol/L   Potassium 4.0 3.5 - 5.1 mmol/L   Chloride 108 101 - 111 mmol/L   CO2 24 22 - 32 mmol/L   Glucose, Bld 94 65 - 99 mg/dL   BUN 10 6 - 20 mg/dL   Creatinine, Ser 1.61 0.44 - 1.00 mg/dL   Calcium 8.6 (L) 8.9 - 10.3 mg/dL   GFR calc non Af Amer >60 >60 mL/min   GFR calc Af Amer >60 >60 mL/min   Anion gap 5 5 - 15  CBC  Result Value Ref Range   WBC 4.7 4.0 - 10.5 K/uL   RBC 3.17 (L) 3.87 - 5.11 MIL/uL   Hemoglobin 10.7 (L) 12.0 - 15.0 g/dL   HCT 09.6 (L) 04.5 - 40.9 %   MCV 96.5 78.0 - 100.0 fL   MCH 33.8 26.0 - 34.0 pg   MCHC 35.0 30.0 - 36.0 g/dL   RDW 81.1 91.4 - 78.2 %   Platelets 470 (H) 150 - 400 K/uL  Urinalysis, Routine w reflex microscopic  Result Value Ref Range   Color, Urine YELLOW YELLOW   APPearance CLEAR CLEAR   Specific Gravity, Urine 1.015 1.005 - 1.030   pH 5.0 5.0 - 8.0   Glucose, UA NEGATIVE NEGATIVE mg/dL   Hgb urine dipstick SMALL (A) NEGATIVE   Bilirubin Urine NEGATIVE NEGATIVE   Ketones, ur NEGATIVE NEGATIVE mg/dL   Protein, ur NEGATIVE NEGATIVE mg/dL   Nitrite NEGATIVE NEGATIVE   Leukocytes, UA NEGATIVE NEGATIVE   RBC / HPF 0-5 0 - 5 RBC/hpf   WBC, UA 0-5 0 - 5 WBC/hpf   Bacteria, UA NONE SEEN NONE SEEN   Squamous Epithelial / LPF 0-5 (A) NONE SEEN   Mucous PRESENT   Pregnancy, urine  Result Value Ref Range   Preg Test, Ur NEGATIVE NEGATIVE  TSH  Result Value Ref Range   TSH 15.661 (H) 0.350 - 4.500 uIU/mL    No results found.  8:30 AM UA without signs of infection.  preg negative.  BP remain soft but stable.  No signs of infection/sepsis at this time.  Tolerating oral fluids and medications.  D/c home with scripts per PA Geiple.  She understands to follow-up with her rheumatologist as soon as possible.  Return precautions given for any new/worsening symptoms.   Garlon Hatchet, PA-C 06/25/16 [phone]

## 2016-06-25 NOTE — Discharge Instructions (Signed)
Please read and follow all provided instructions.  Your diagnoses today include:  1. Hypothyroidism due to Hashimoto's thyroiditis   2. Generalized weakness   3. Nausea and vomiting, intractability of vomiting not specified, unspecified vomiting type     Tests performed today include:  TSH - very high indicating very low thyroid, the value was 15.66  Blood counts and electrolytes  Vital signs. See below for your results today.   Medications prescribed:   Levothyroxine - thyroid hormone  Take any prescribed medications only as directed.  Home care instructions:  Follow any educational materials contained in this packet.  BE VERY CAREFUL not to take multiple medicines containing Tylenol (also called acetaminophen). Doing so can lead to an overdose which can damage your liver and cause liver failure and possibly death.   Follow-up instructions: Please call your rheumatologist today as we discussed for instructions regarding your thyroid medication and for follow-up.  Return instructions:   Please return to the Emergency Department if you experience worsening symptoms.   Please return if you have any other emergent concerns.  Additional Information:  Your vital signs today were: BP (!) 86/44    Pulse 80    Temp 98.3 F (36.8 C) (Oral)    Resp 20    Ht  (1.575 m)    Wt 69.9 kg    SpO2 98%    BMI 28.17 kg/m  If your blood pressure (BP) was elevated above 135/85 this visit, please have this repeated by your doctor within one month. --------------

## 2016-06-25 NOTE — ED Triage Notes (Signed)
Pt states she has been feeling weak for the past week and a half.  States she has thyroid problems has been out of her medication for a while.  Pt states she does not go to her primary care doctor and "comes here a lot".  States her PCP keeps telling her to go to [facility].  Hx of anemia and is going for an infusion next Friday. Afebrile on assessment.

## 2017-05-24 IMAGING — CT CT HEAD W/O CM
3 of 4 series · 14 of 47 positions shown, 16 images · non-contrast
Comparison: None.

CLINICAL DATA: Headache at base of neck with vomiting for 2 days.

EXAM:
CT HEAD WITHOUT CONTRAST
TECHNIQUE: Contiguous axial images were obtained from the base of the skull
through the vertex without intravenous contrast.

[Series 2: head w/o · axial · non-contrast · 0.45mm/px · z∈[+200,+325]mm · 8 of 31 slices shown, 10 images]
[im 3/31  brain]
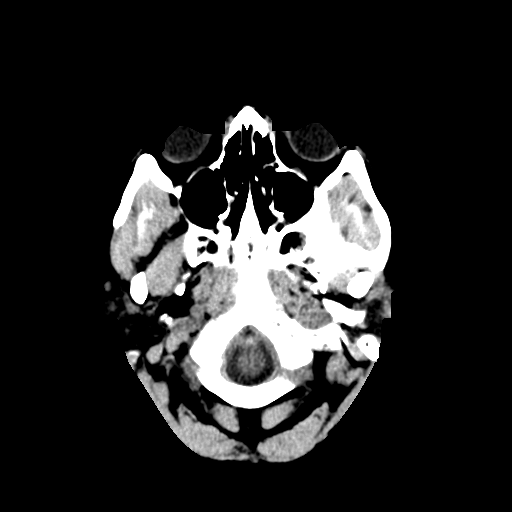
[im 3/31  bone]
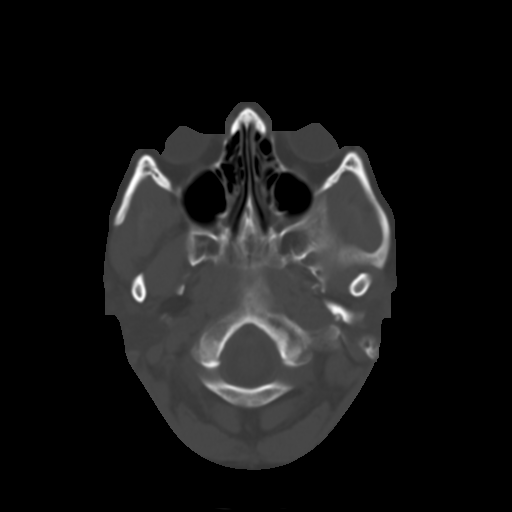
[im 7/31  brain]
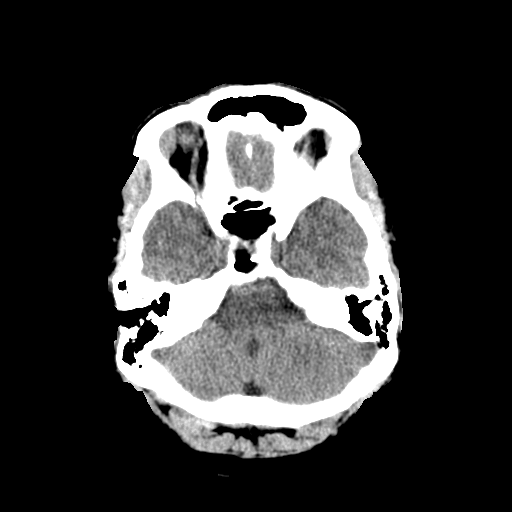
[im 11/31  brain]
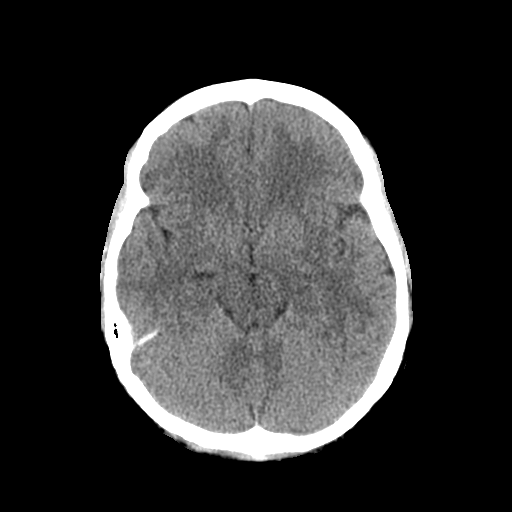
[im 13/31  brain]
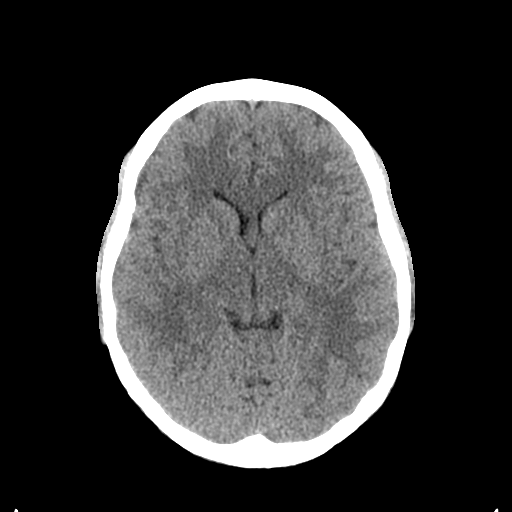
[im 18/31  brain]
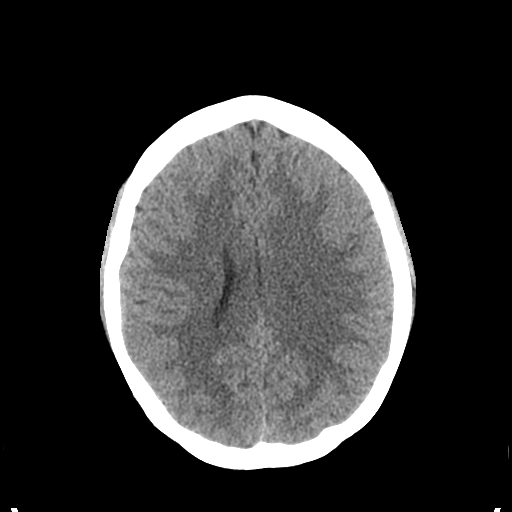
[im 18/31  bone]
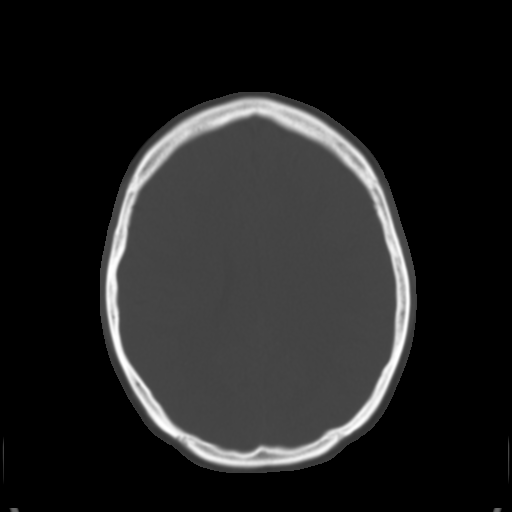
[im 20/31  brain]
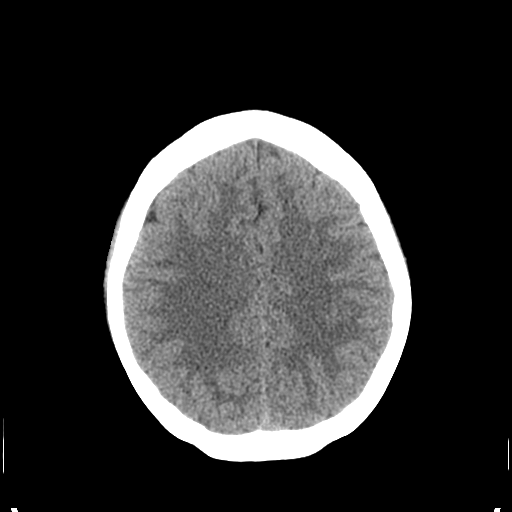
[im 24/31  brain]
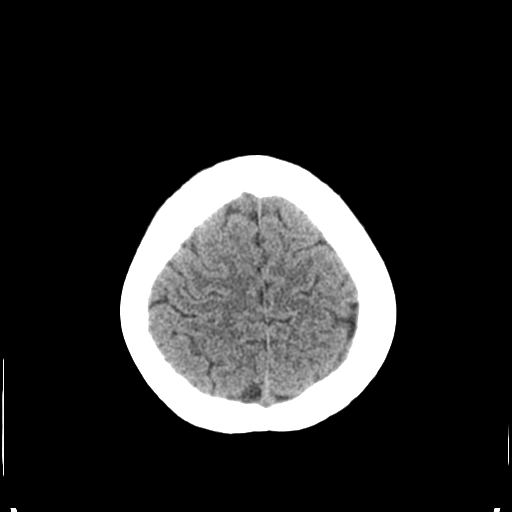
[im 28/31  brain]
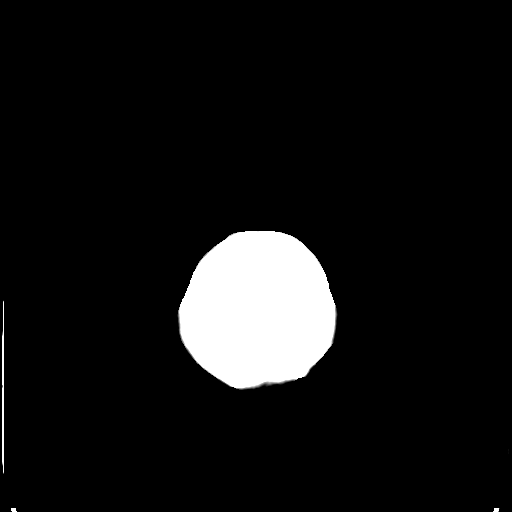

[Series 4: coronal · coronal · 0.29mm/px · 3 of 77 slices shown]
[im 26/77  brain]
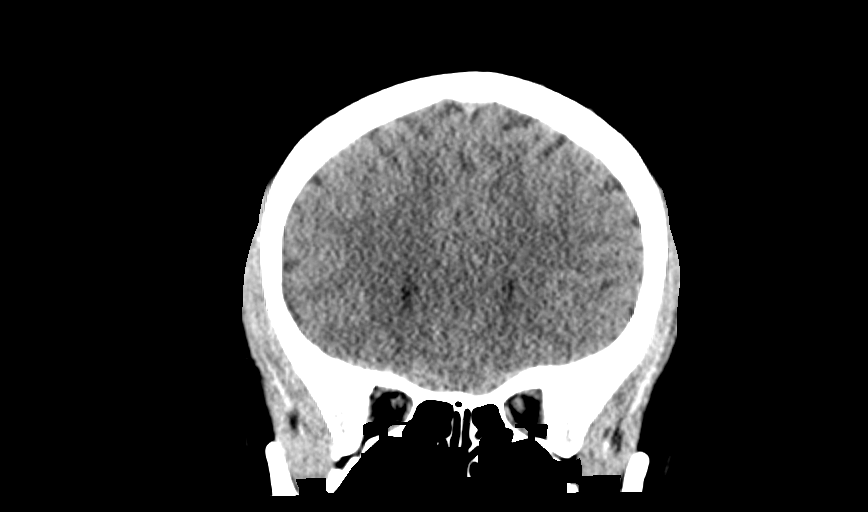
[im 34/77  brain]
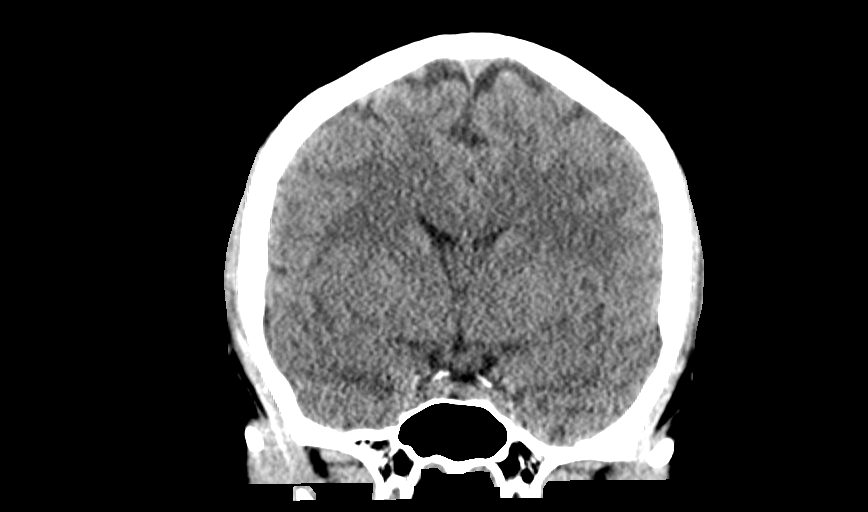
[im 43/77  brain]
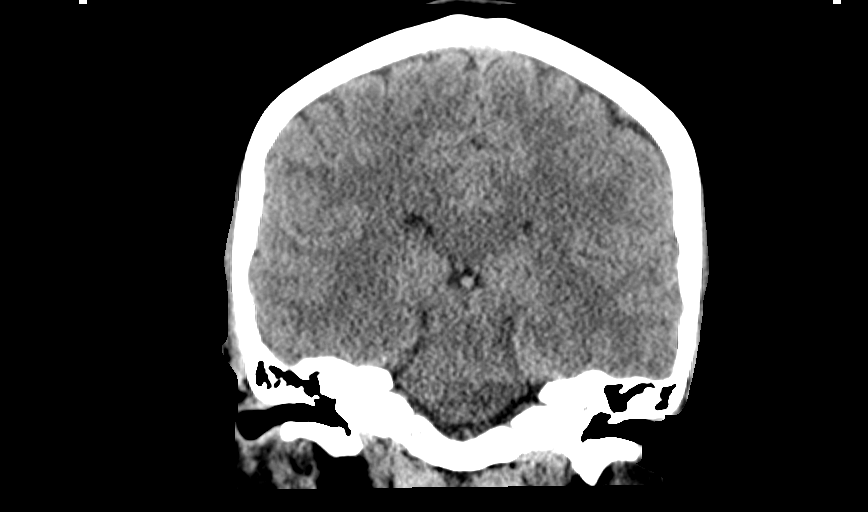

[Series 5: sagittal · sagittal · 0.29mm/px · 3 of 77 slices shown]
[im 26/77  brain]
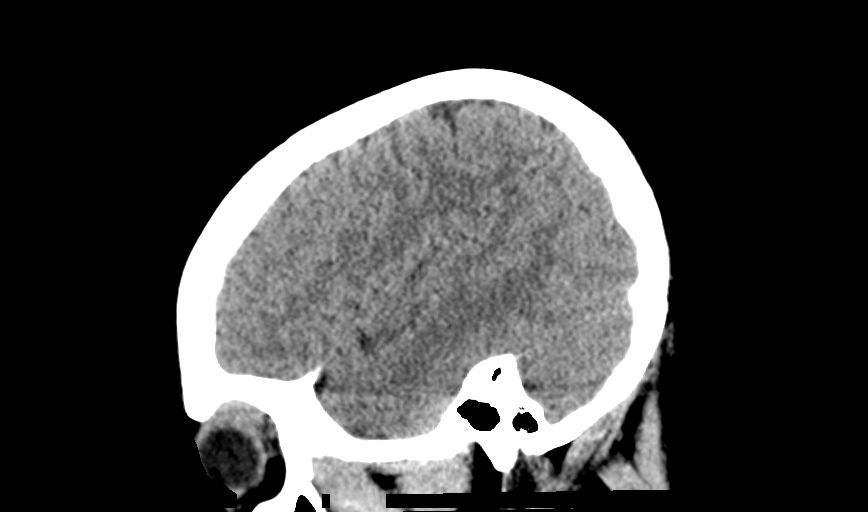
[im 39/77  brain]
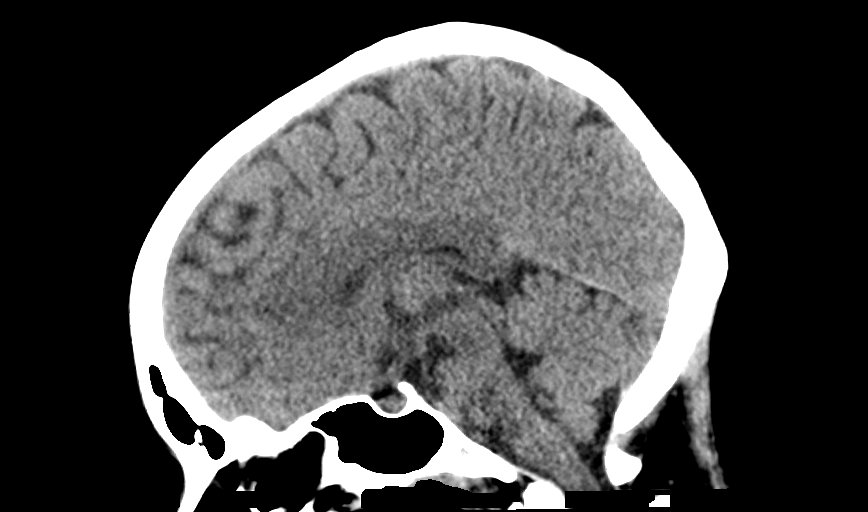
[im 51/77  brain]
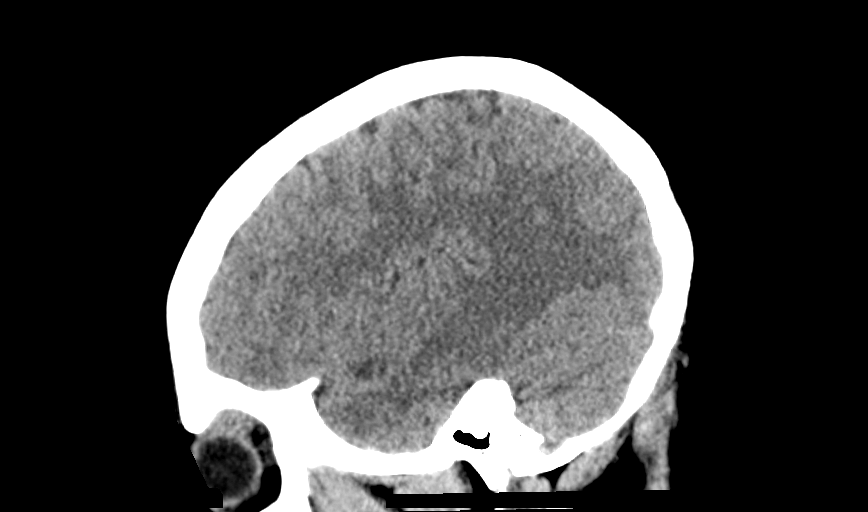

[14 of 47 positions shown; findings below may reference images not displayed]

FINDINGS: Brain: No acute intracranial hemorrhage. No focal mass lesion. No CT
evidence of acute infarction. No midline shift or mass effect. No
hydrocephalus. Basilar cisterns are patent.

Vascular: No hyperdense vessel or unexpected calcification.

Skull: Normal. Negative for fracture or focal lesion.

Sinuses/Orbits: Paranasal sinuses and mastoid air cells are clear.
Orbits are clear.

Other: None.
IMPRESSION: Normal head CT.
